# Patient Record
Sex: Female | Born: 1960 | Race: Black or African American | Hispanic: No | Marital: Married | Smoking: Former smoker
Health system: Southern US, Community
[De-identification: ages and names within clinical notes are randomized; demographics above are authoritative.]

## PROBLEM LIST (undated history)

## (undated) DIAGNOSIS — D573 Sickle-cell trait: Secondary | ICD-10-CM

## (undated) DIAGNOSIS — G43909 Migraine, unspecified, not intractable, without status migrainosus: Secondary | ICD-10-CM

## (undated) DIAGNOSIS — IMO0001 Reserved for inherently not codable concepts without codable children: Secondary | ICD-10-CM

## (undated) DIAGNOSIS — E538 Deficiency of other specified B group vitamins: Secondary | ICD-10-CM

## (undated) DIAGNOSIS — Z5189 Encounter for other specified aftercare: Secondary | ICD-10-CM

## (undated) DIAGNOSIS — I10 Essential (primary) hypertension: Secondary | ICD-10-CM

## (undated) DIAGNOSIS — O2230 Deep phlebothrombosis in pregnancy, unspecified trimester: Secondary | ICD-10-CM

## (undated) HISTORY — PX: TUBAL LIGATION: SHX77

---

## 2005-01-21 ENCOUNTER — Ambulatory Visit: Payer: Self-pay | Admitting: Family Medicine

## 2005-08-05 ENCOUNTER — Emergency Department (HOSPITAL_COMMUNITY): Admission: EM | Admit: 2005-08-05 | Discharge: 2005-08-05 | Payer: Self-pay | Admitting: Emergency Medicine

## 2007-08-19 ENCOUNTER — Ambulatory Visit (HOSPITAL_COMMUNITY): Admission: RE | Admit: 2007-08-19 | Discharge: 2007-08-19 | Payer: Self-pay | Admitting: Family Medicine

## 2007-12-02 ENCOUNTER — Encounter: Payer: Self-pay | Admitting: Family Medicine

## 2008-06-15 ENCOUNTER — Observation Stay (HOSPITAL_COMMUNITY): Admission: EM | Admit: 2008-06-15 | Discharge: 2008-06-16 | Payer: Self-pay | Admitting: Emergency Medicine

## 2008-06-16 ENCOUNTER — Ambulatory Visit: Payer: Self-pay | Admitting: Internal Medicine

## 2008-06-16 ENCOUNTER — Encounter (INDEPENDENT_AMBULATORY_CARE_PROVIDER_SITE_OTHER): Payer: Self-pay | Admitting: Internal Medicine

## 2008-06-19 ENCOUNTER — Ambulatory Visit: Payer: Self-pay | Admitting: Gastroenterology

## 2008-06-27 ENCOUNTER — Ambulatory Visit: Payer: Self-pay | Admitting: Gastroenterology

## 2008-06-27 ENCOUNTER — Encounter: Payer: Self-pay | Admitting: Gastroenterology

## 2008-06-27 ENCOUNTER — Ambulatory Visit (HOSPITAL_COMMUNITY): Admission: RE | Admit: 2008-06-27 | Discharge: 2008-06-27 | Payer: Self-pay | Admitting: Gastroenterology

## 2008-07-12 ENCOUNTER — Ambulatory Visit: Payer: Self-pay | Admitting: Cardiology

## 2008-07-18 ENCOUNTER — Encounter (HOSPITAL_COMMUNITY): Admission: RE | Admit: 2008-07-18 | Discharge: 2008-08-17 | Payer: Self-pay | Admitting: Oncology

## 2008-07-18 ENCOUNTER — Ambulatory Visit (HOSPITAL_COMMUNITY): Payer: Self-pay | Admitting: Oncology

## 2008-09-04 ENCOUNTER — Ambulatory Visit: Payer: Self-pay | Admitting: Internal Medicine

## 2008-12-22 ENCOUNTER — Encounter (HOSPITAL_COMMUNITY): Admission: RE | Admit: 2008-12-22 | Discharge: 2009-01-21 | Payer: Self-pay | Admitting: Oncology

## 2008-12-22 ENCOUNTER — Ambulatory Visit (HOSPITAL_COMMUNITY): Payer: Self-pay | Admitting: Oncology

## 2009-05-30 DIAGNOSIS — I10 Essential (primary) hypertension: Secondary | ICD-10-CM | POA: Insufficient documentation

## 2009-05-30 DIAGNOSIS — R9431 Abnormal electrocardiogram [ECG] [EKG]: Secondary | ICD-10-CM

## 2009-05-30 DIAGNOSIS — I6529 Occlusion and stenosis of unspecified carotid artery: Secondary | ICD-10-CM | POA: Insufficient documentation

## 2009-06-26 ENCOUNTER — Encounter: Payer: Self-pay | Admitting: Gastroenterology

## 2010-03-20 IMAGING — US US CAROTID DUPLEX BILAT
1 series · 13 of 24 positions shown · non-contrast
Comparison: None

CLINICAL DATA: POSSIBLE BRUIT

BILATERAL CAROTID DUPLEX ULTRASOUND
TECHNIQUE: Gray scale imaging, color Doppler and duplex ultrasound
was performed of bilateral carotid and vertebral arteries in the
neck.

[Series 1: us carotid duplex bilat · 0.08mm/px · 13 of 79 slices shown]
[im 1/79]
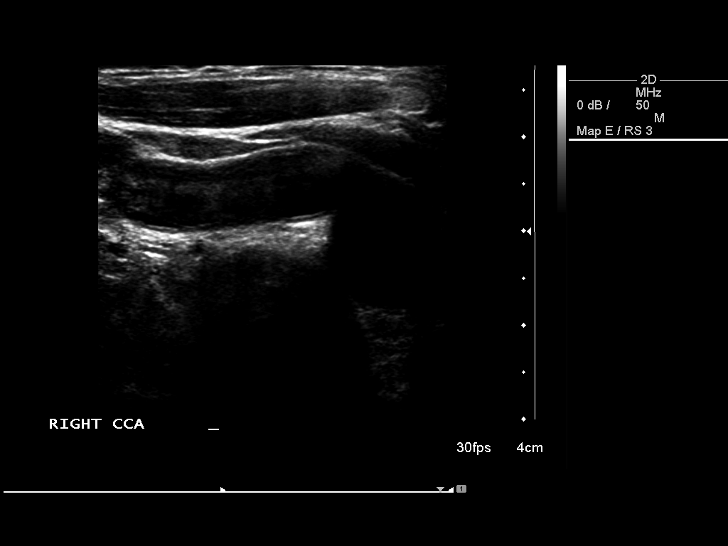
[im 7/79]
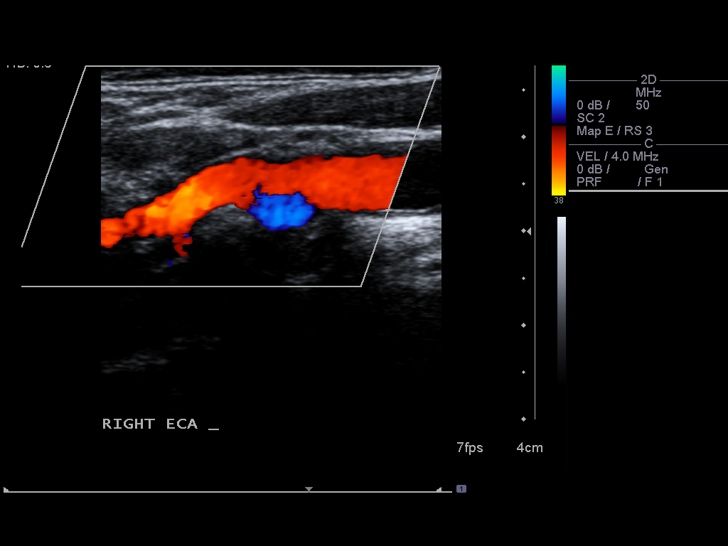
[im 14/79]
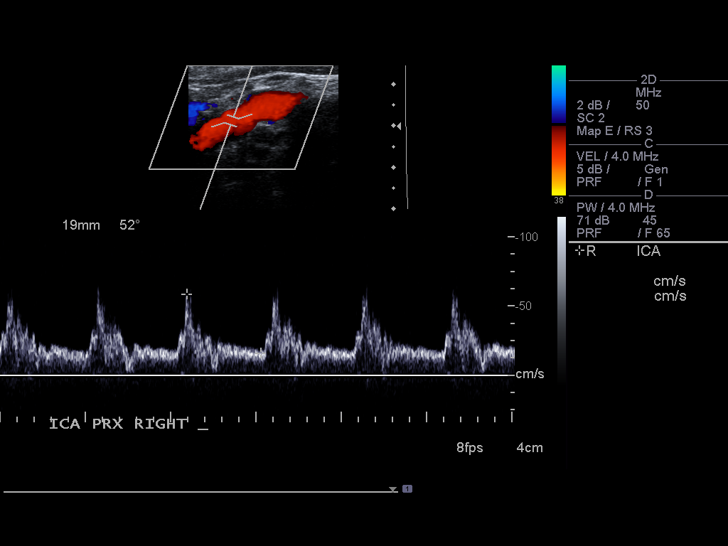
[im 21/79]
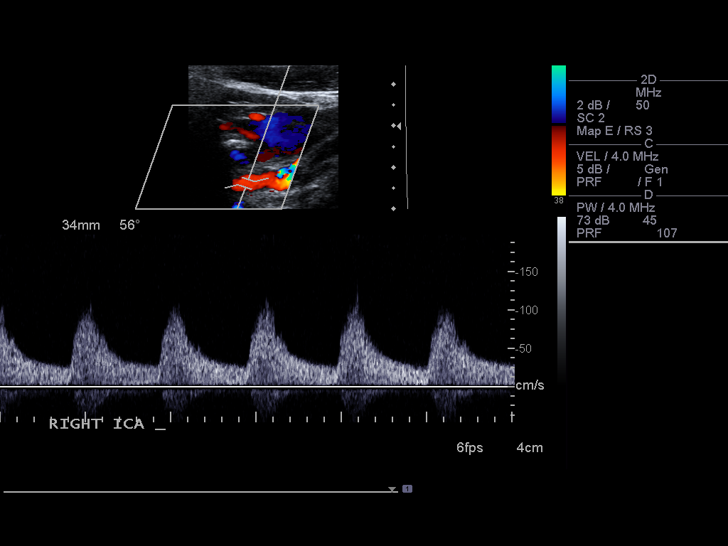
[im 28/79]
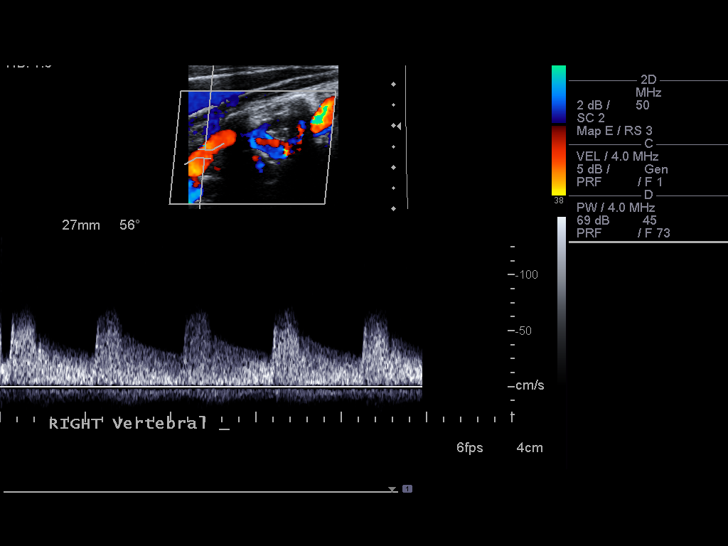
[im 34/79]
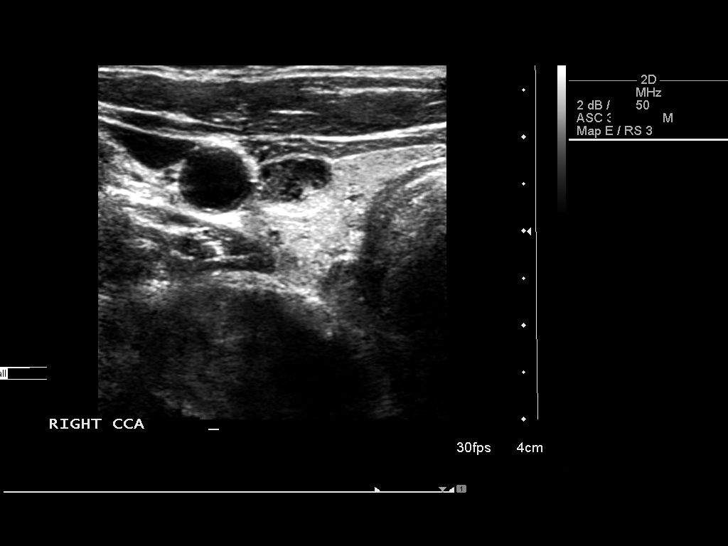
[im 41/79]
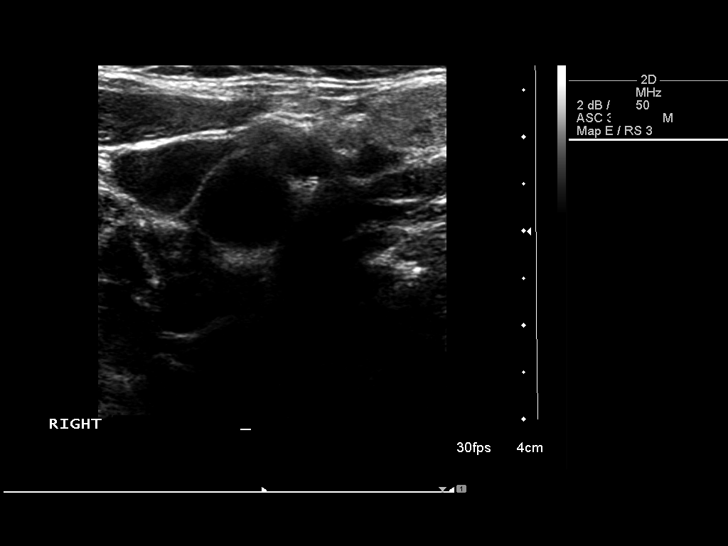
[im 45/79]
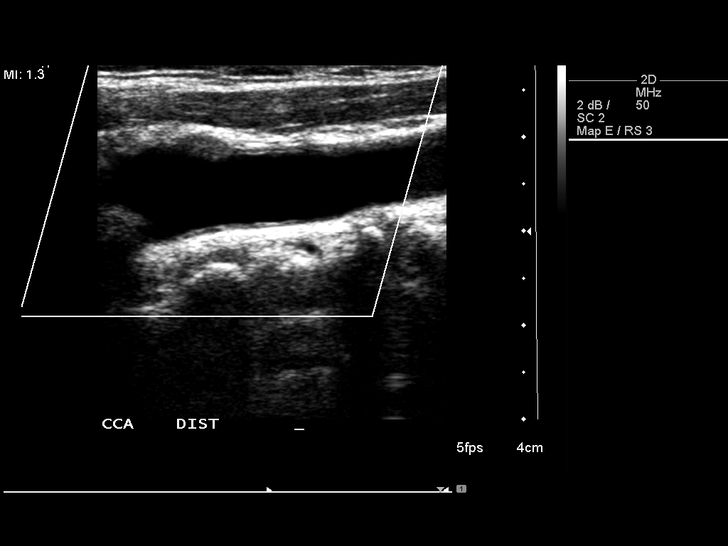
[im 51/79]
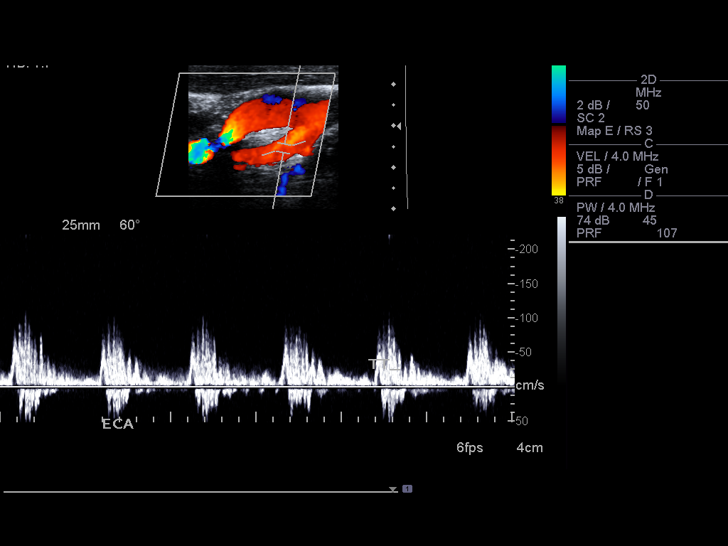
[im 58/79]
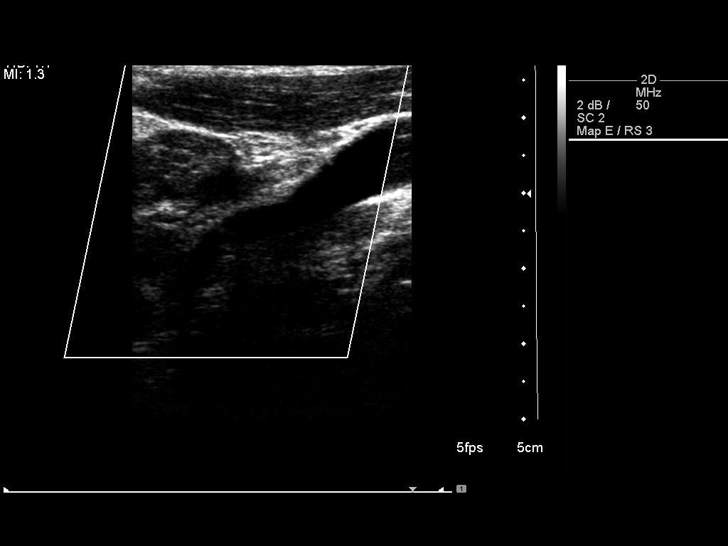
[im 65/79]
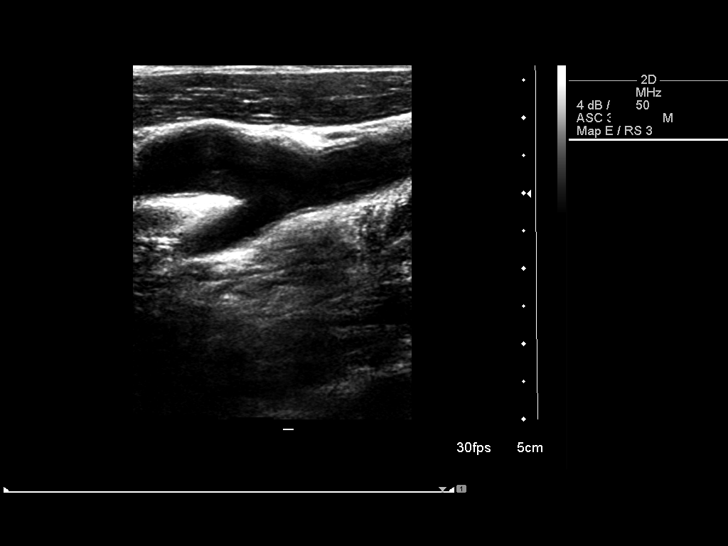
[im 72/79]
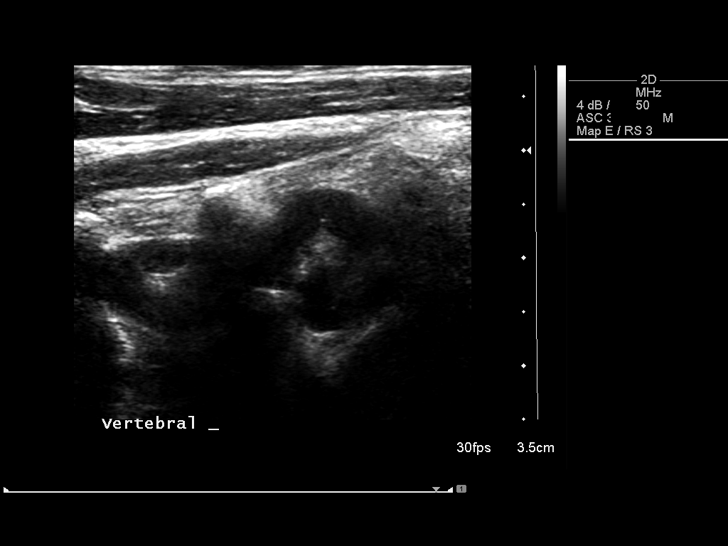
[im 79/79]
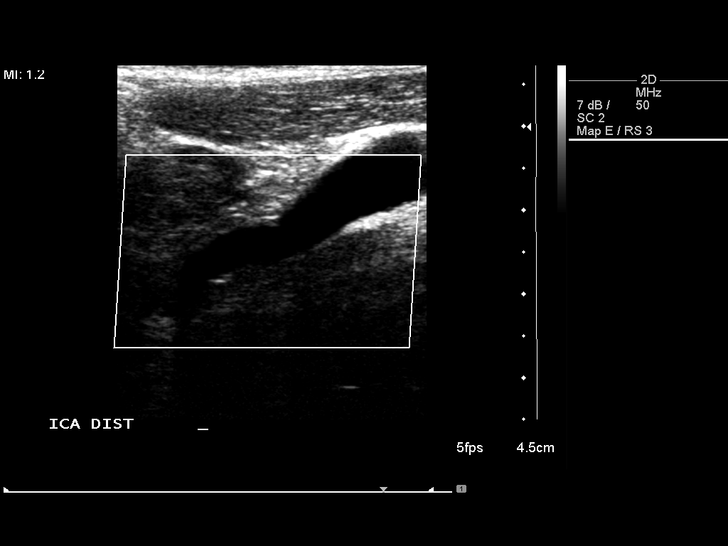

[13 of 24 positions shown; findings below may reference images not displayed]

Criteria:  Quantification of carotid stenosis is based on velocity
parameters that correlate the residual internal carotid diameter
with NASCET-based stenosis levels.

The following velocity measurements were obtained:

                 PEAK SYSTOLIC/END DIASTOLIC
RIGHT
ICA:                        126cm/sec
CCA:                        87cm/sec
SYSTOLIC ICA/CCA RATIO:
DIASTOLIC ICA/CCA RATIO:
ECA:                        83cm/sec

LEFT
ICA:                        180cm/sec
CCA:                        81cm/sec
SYSTOLIC ICA/CCA RATIO:
DIASTOLIC ICA/CCA RATIO:
ECA:                        106cm/sec
FINDINGS: RIGHT CAROTID ARTERY: Right carotid arteries are patent with
antegrade flow.  There is minimal atherosclerotic plaque in the
right carotid arteries.  There is a normal low resistance waveform
involving the right internal carotid artery.  The peak systolic
velocity in the right internal carotid arteries 126 cm/sec but this
is in the distal aspect.  The mid right internal carotid artery has
a peak systolic velocity of 63 cm/sec.  Distal right internal
carotid artery is deep and there may be a small amount of plaque in
this region.

RIGHT VERTEBRAL ARTERY:  Antegrade flow in the right vertebral
artery with a normal low resistance waveform.

LEFT CAROTID ARTERY: Antegrade flow in the left carotid arteries
without significant plaque.  Similar to the right side, the distal
left internal carotid artery is tortuous with an elevated peak
systolic velocity of 180 cm/sec.  The mid left internal carotid
artery has a peak systolic velocity of 101 cm/sec.

LEFT VERTEBRAL ARTERY:  Antegrade flow in the left ]  Vertebral
artery with a normal waveform.
IMPRESSION: Minimal atherosclerotic plaque in the carotid arteries.  The
proximal internal carotid arteries appear to be widely patent.
Slightly elevated peak systolic velocities in the distal internal
carotid arteries bilaterally.  Suspect that this finding is related
to the configuration of these vessels rather than true stenosis.
Degree of narrowing measures between 50-69% based on the peak
systolic velocities but feel that this is an overestimation.
Recommend follow-up surveillance.

## 2010-05-09 ENCOUNTER — Ambulatory Visit (HOSPITAL_COMMUNITY): Admission: RE | Admit: 2010-05-09 | Discharge: 2010-05-09 | Payer: Self-pay | Admitting: Family Medicine

## 2010-12-31 NOTE — Letter (Signed)
Summary: rpc chart  rpc chart   Imported By: Curtis Sites 09/12/2010 15:55:28  _____________________________________________________________________  External Attachment:    Type:   Image     Comment:   External Document

## 2011-03-17 LAB — FERRITIN: Ferritin: 44 ng/mL (ref 10–291)

## 2011-03-17 LAB — CBC
HCT: 35.2 % — ABNORMAL LOW (ref 36.0–46.0)
Hemoglobin: 11.8 g/dL — ABNORMAL LOW (ref 12.0–15.0)
RBC: 4.07 MIL/uL (ref 3.87–5.11)
RDW: 14.2 % (ref 11.5–15.5)
WBC: 5 10*3/uL (ref 4.0–10.5)

## 2011-03-17 LAB — METHYLMALONIC ACID, SERUM: Methylmalonic Acid, Quantitative: 139 nmol/L (ref 87–318)

## 2011-03-17 LAB — VITAMIN B12: Vitamin B-12: 647 pg/mL (ref 211–911)

## 2011-04-15 NOTE — Assessment & Plan Note (Signed)
NAME:  CATHELINE, HIXON               CHART#:  [id number]   DATE:  09/04/2008                       DOB:  [birth year]   CHIEF COMPLAINT:  Follow up of procedure.   SUBJECTIVE:  The patient is here for followup.  She underwent EGD with  ileocolonoscopy on June 27, 2008, for history of iron deficiency anemia.  She had multiple sessile gastric polyps in the antrum.  Two 1-cm gastric  polyps were removed via snare cautery.  Bases were tattooed.  The  remaining polyps were biopsied.  The pathology revealed hyperplastic  polyps.  No evidence of H. pylori.  It was felt that if she had ongoing  iron deficiency anemia, she may need to have remaining polyps removed.  She presents back today.  She had blood work done on August 31, 2008.  Her hemoglobin is now 13.6, and her ferritin is 51.  She feels well.  She sees no blood in her stool or melena.  No abdominal pain.  No  nausea, vomiting or heartburn.  She was also noted to be B12 deficient  and is receiving monthly injections.   CURRENT MEDICATIONS:  See updated list.   ALLERGIES:  No known drug allergies.   PHYSICAL EXAMINATION:  VITAL SIGNS:  Weight 157, temp 98.1, blood  pressure 124/70, and pulse 76.  GENERAL:  A pleasant, well-nourished, well-developed black female in no  acute distress.  SKIN:  Warm and dry.  No jaundice.  HEENT:  Sclerae nonicteric.  Oropharyngeal mucosa moist and pink.  ABDOMEN:  Positive bowel sounds.  Abdomen is soft.   IMPRESSION:  The patient is a 50 year old lady with history of profound  anemia and iron deficiency as well as B12 deficiency.  She was found to  have multiple gastric polyps, two were 1 cm in size and were removed via  snare cautery.  We felt that this could be the source of her iron  deficiency anemia.  She has been doing very well.  Her hemoglobin is now  normal with a ferritin that is normal as well.  I would recommend her  continue iron therapy at least another 3 months.   PLAN:  1. She  will continue to have her hemoglobin checked periodically      through Dr. Otilio Saber Office.  She states she is getting monthly      labs done due to her B12 as well with his office.  2. She will continue to follow up with Dr. Mariel Sleet as planned.  3. Continue iron for 3 more months.  4. If she has recurrent iron deficiency anemia at any point, we will      consider repeat EGD for      gastric polypectomy as recommended by Dr. Kassie Mends.  5. Office visit p.r.n.       Tana Coast, P.A.  Electronically Signed     Kassie Mends, M.D.  Electronically Signed    LL/MEDQ  D:  09/05/2008  T:  09/05/2008  Job:  [id number]   cc:   Melvyn Novas, MD  Ladona Horns. Mariel Sleet, MD

## 2011-04-15 NOTE — H&P (Signed)
NAME:  Annette Cox, MCALPINE              ACCOUNT NO.:  [id number]   MEDICAL RECORD NO.:  [id number]          PATIENT TYPE:  OBV   LOCATION:  A339                          FACILITY:  [facility]   PHYSICIAN:  Osvaldo Shipper, MD     DATE OF BIRTH:  [birth year]   DATE OF ADMISSION:  06/15/2008  DATE OF DISCHARGE:  LH                              HISTORY & PHYSICAL   The patient does not have a PMD.  She goes to the [facility] for her medical needs.   ADMISSION DIAGNOSES:  1. Severe microcytic anemia.  2. Hypertension.  3. 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  4. Chest pressure.   CHIEF COMPLAINT:  Lightheadedness, dizziness, and chest pressures this  morning.   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   Dictation ended at this point.   PLEASE SEE ADDENDUM      Osvaldo Shipper, MD  Electronically Signed     GK/MEDQ  D:  06/15/2008  T:  06/16/2008  Job:  [id number]

## 2011-04-15 NOTE — Op Note (Signed)
NAME:  BLAYRE, PAPANIA              ACCOUNT NO.:  [id number]   MEDICAL RECORD NO.:  [id number]          PATIENT TYPE:  AMB   LOCATION:  DAY                           FACILITY:  [facility]   PHYSICIAN:  Kassie Mends, M.D.      DATE OF BIRTH:  1961/06/23   DATE OF PROCEDURE:  06/27/2008  DATE OF DISCHARGE:                               OPERATIVE REPORT   REFERRING PHYSICIAN:  [facility].   PROCEDURE:  1. Ileocolonoscopy.  2. Esophagogastroduodenoscopy with cold forceps biopsy and snare      cautery polypectomy.   INDICATION FOR EXAM:  Ms. Mccomas is a 50 year old female who presents  with a ferritin less than 1.  She has a menstrual cycle once every 3 to  6 months.  She does not eat meat every day.  She denies any black tarry  stools.  She uses ibuprofen less than once a week.  She denies any  diarrhea, change in bowel habits or rectal bleeding.  She was started on  iron approximately 2 weeks ago.   FINDINGS:  1. Fair bowel prep.  Polyps less than 5 mm could have been missed.  2. Normal terminal ileum approximately 10 cm visualized.  3. Normal colon without evidence of polyps, masses, inflammatory      changes, diverticula or arteriovenous malformations.  4. Normal retroflexed view of the rectum.  5. Normal esophagus without evidence of Barrett's mass, erosion,      ulceration or stricture.  6. Small hiatal hernia.  7. Multiple sessile gastric polyps seen in the antrum.  Two 1 cm      gastric polyps were removed via snare cautery.  The bases were      tattooed with Spot.  The remaining polyps were biopsied via cold      forceps.  Each polyp was sent in a separate container.  The cold      forceps biopsies were sent in a third container.  No ulcers      appreciated.  8. Normal duodenal bulb and second portion of the duodenum.   DIAGNOSIS:  1. Gastric polyps, likely source for her iron deficiency anemia.  2. Normal colonoscopy.  3. Elevated blood  pressure.   RECOMMENDATIONS:  1. She should avoid aspirin and NSAIDs for 30 days.  No      anticoagulation for 7 days.  2. Will call with the results of her biopsies.  3. Follow-up appointment in 4 weeks with Tana Coast.  4. Add clonidine 0.1 mg b.i.d.  for the next 72 hours.  5. Appointment with [facility] on June 28, 2008 around 9:30 a.m.   MEDICATIONS:  1. Demerol 100 mg IV.  2. Versed 7 mg IV.  3. Hydralazine 10 mg IV.   PROCEDURE TECHNIQUE:  Physical exam was performed.  Informed consent was  obtained from the patient after explaining benefits, risks and  alternatives to procedure.  The patient was connected to monitor and  placed in left lateral position.  Continuous oxygen was provided by  nasal cannula and IV medicine  administered through an indwelling  cannula.  After administration of sedation and rectal exam, the  patient's rectum was intubated and the scope was advanced under direct  visualization to the distal terminal ileum.  The scope was removed  slowly by carefully examining the color, texture, anatomy and integrity  of the mucosa on the way out.   After the colonoscopy, the patient's esophagus was intubated with a  diagnostic gastroscope and the scope was advanced under direct  visualization to the second portion of the duodenum.  The scope was  removed slowly.  The retroflex view of the cardia was performed.  The  first polyp was snared with snare cautery (200/25).  The scope was  removed by carefully examining the color, texture, anatomy and integrity  of mucosa on the way out.  The patient's esophagus was then intubated  with a diagnostic gastroscope and advanced under direct visualization to  the antrum.  The second polyp was removed via snare cautery (200/25).  Both bases of the larger polyps were tattooed with Spot.  Each polyp was  removed with the Lucina Mellow net.  The scope was removed.  The scope was then  reintroduced into  the esophagus and advanced into the antrum.  Cold  forceps biopsies were obtained.  The scope was removed slowly by  carefully examining the color, texture, anatomy and integrity of the  mucosa on the way out.  Patients BP remained elevated and hydralzine was  administered. The patient was recovered in endoscopy and discharged home  in satisfactory condition.   PATH:  Hyperplastic polyp      Kassie Mends, M.D.  Electronically Signed     SM/MEDQ  D:  06/27/2008  T:  06/27/2008  Job:  [id number]

## 2011-04-15 NOTE — Discharge Summary (Signed)
NAME:  Annette Cox, Annette Cox              ACCOUNT NO.:  [id number]   MEDICAL RECORD NO.:  [id number]          PATIENT TYPE:  OBV   LOCATION:  A339                          FACILITY:  [facility]   PHYSICIAN:  Osvaldo Shipper, MD     DATE OF BIRTH:  1961-02-01   DATE OF ADMISSION:  06/15/2008  DATE OF DISCHARGE:  07/17/2009LH                               DISCHARGE SUMMARY   The patient goes to the health department for her medical needs.   Please review my H&P dictated yesterday for details regarding the  patient's presenting illness.   DISCHARGE DIAGNOSES:  1. Severe iron deficiency anemia.  2. Sickle cell trait.  3. Status post blood transfusion.  4. Vitamin B12 Deficiency   BRIEF HOSPITAL COURSE:  Briefly, this is a 50 year old African American  female who has a remote history of DVT and hypertension, who was in her  usual state of health until actually a few months ago, 6 to 8 months  ago, when she started noticing that she was feeling more weak and was  getting short of breath with activities.  The patient ignored those  symptoms.  Yesterday, when she was at work in a poorly-ventilated area,  she felt really hot, sweaty, got light-headed, dizzy, and experienced  brief chest pressure.  The patient came into the ED to get herself  evaluated.  She underwent blood work that showed a hemoglobin of 4.8.  The rest of her labs were pretty unremarkable.  Her MCV was 54.  LFTs  were normal.  Iron profile studies were sent, which showed a ferritin of  less than 1, B12 was 200.  She was found to be heme negative and had  brown stool on rectal exam done by the ED physician.  She admits to  going through menopause with periods every 6 months or so.  She is  having period right now, but she states not really heavy.  The patient  was transfused 2 units of blood.  Her hemoglobin has gone to 8.9.  Really, no other overt bleeding was noted in this individual.  It is  felt that the patient would require  evaluation as an outpatient with  gastroenterology, as she is 50 and will probably require colonoscopy at  least.  This has been arranged for her.  Her sickle cell trait had come  back positive.  Because of her iron deficiency anemia and B12  deficiency, I am going to go ahead and set her up with Dr. Mariel Sleet as  well.  I will give her a shot of B12 before she goes home today.  Nu-  Iron will also be prescribed to her on an outpatient basis.  Once again,  it should be noted that the patient is not having any overt GI bleeding.  She is having some minimal menstrual periods at this time.  No masses  are appreciated on physical examination.  Hemodynamically, she is  stable, so I think the rest of the evaluations can be done as an  outpatient.   On the day of discharge, she seemed better.  Her symptoms have  almost  resolved.  Vital signs are stable.  Yesterday, she was hypertensive in  the ED.  her blood pressures have improved, 140/80.  Still not very  optimum, but I would rather the health department follow up for her  blood pressure.  Home medications can be initiated at this time.  Examination otherwise was unremarkable.   Cardiac markers have been negative.  EKG did not show any significant  abnormalities.  Echocardiogram is pending at this time.  Once the echo  is done, she can be discharged home.  I will follow up on the results of  the echocardiogram and, if it is significantly abnormal, we will set her  up with a cardiologist.   DISCHARGE MEDICATIONS:  1. Nu-Iron 150 mg once daily for 1 week and then b.i.d.  2. Omeprazole 20 mg once daily for 3 weeks.  3. Vitamin B12 1000 mcg every month.  She will be given 1 dose today      and needs to start taking this on a monthly basis starting August      17.  A prescription will be written for the same.   FOLLOWUP:  1. Gastroenterology, appointment has been made on June 19, 2008 at      10:45 a.m. with Dr. Jena Gauss or Dr. Cira Servant.  Phone  number [phone].  2. With Dr. Mariel Sleet in 1 month.  3. With health department for blood pressure.  This, she can do on her      own.  4. I have also told her that she will need to be seen by a      gynecologist at some point.  I will let her choose her      gynecologist.  There is no urgent need.   DIET:  Regular.   PHYSICAL ACTIVITY:  No restrictions.   I have told her that, if she notices excessive bleeding anywhere, she  again feels light-headed and dizzy, experiences chest pain, she needs to  seek attention immediately.   Carotid Dopplers have just been resulted.  No significant stenosis was  noted, although followup was recommended for her.   Addendum:  Message was left with [facility] to make an appointment for the  patient due to abnormal EKG.      Osvaldo Shipper, MD  Electronically Signed     GK/MEDQ  D:  06/16/2008  T:  06/16/2008  Job:  [id number]

## 2011-04-15 NOTE — Consult Note (Signed)
NAME:  Annette Cox, Annette Cox              ACCOUNT NO.:  [id number]   MEDICAL RECORD NO.:  [id number]          PATIENT TYPE:  AMB   LOCATION:  DAY                           FACILITY:  [facility]   PHYSICIAN:  Kassie Mends, M.D.      DATE OF BIRTH:  [birth year]   DATE OF CONSULTATION:  DATE OF DISCHARGE:                                 CONSULTATION   REQUESTING PHYSICIAN:  Osvaldo Shipper, MD   HISTORY OF PRESENT ILLNESS:  Ms. Larrick is a very pleasant African  American female who presented to the hospital on June 15, 2008, for  further evaluation of lightheadedness, dizziness and chest pressure.  All these symptoms started when she was at work.  She has working in the  [employer] where there is not much ventilation and there  was lot of heat.  She states usually she is able to just put cold wash  right on her head and she feels better shortly afterwards.  At this  time, however, she felt very lightheaded like she might pass out.  She  decided therefore to come to the hospital.  She also had some shortness  of breath when this happened.  Generally, she has been fatigued which  she thought was because of all the work she has been doing.  She denies  any abdominal pain, nausea or vomiting, constipation, diarrhea, melena  or rectal bleeding.  She has had no hematemesis or nosebleeds.  She has  been going through menopause.  She states she has her period once every  6 months or so.  She had one during this recent evaluation.  Her vaginal  bleeding has been very light.  She denies any dysuria or hematuria.  She  lost 80 pounds over a couple of years stating that was intentional.  She  changed her diet.  She really did not add any exercise.  She notes that  she does not eat a lot of red meat but she does eat a lot of chicken,  fish and dark green vegetables.  She generally has a bowel movement  every 1-2 days.  She has never had a colonoscopy.  She states the last  time she had a blood work  was at the health department about a year ago.  She denies being told she was anemic before.   CURRENT MEDICATIONS:  1. Omeprazole 20 mg daily.  2. Poly-iron 150 mg b.i.d.  3. She takes occasional ibuprofen for headache, but does not take any      NSAIDs or aspirin regularly.   ALLERGIES:  No known drug allergies.   PAST MEDICAL HISTORY:  History of hypertension in the past but has not  required any medications then she lost weight.  History of DVTs.  She  was diagnosed in 30 with her first pregnancy.  She had DVTs in her  second and third pregnancy as well requiring blood thinners.  With her  fourth pregnancy, she was empirically placed on blood thinners and did  not experience any DVTs at that time.  She had one more episode about 10  or 15 years ago.  The patient has a history of sickle cell trait.  She  has had a cesarean section, tubal ligation, no prior EGD or colonoscopy.   FAMILY HISTORY:  Negative for colorectal cancer or IBD or other chronic  GI illnesses.  Her mother has diabetes, hypertension, and heart disease.  Her father died at age 65 of lung cancer.  She had a cousin who died due  to complications of sickle cell disease.   SOCIAL HISTORY:  She lives with her husband.  She has four children.  She is employed at [employer].  She quit smoking 15 years ago.  No alcohol  or drug use.   REVIEW OF SYSTEMS:  See HPI for GI.  See HPI for general and  cardiopulmonary.  GENITOURINARY:  Denies dysuria, hematuria.   PHYSICAL EXAM:  VITAL SIGNS:  Weight 151, height 5 feet 4 inches,  temperature 98, blood pressure 130/82, and pulse 64.  GENERAL:  Pleasant, well-nourished, well-developed pale black female in  no acute distress.  SKIN:  Warm and dry.  No jaundice.  HEENT:  Sclerae nonicteric.  Oropharyngeal mucosa moist and pink.  No  lesions, erythema or exudate.  CHEST:  Lungs are clear to auscultation.  CARDIAC:  Reveals regular rate and rhythm.  Normal S1-S2.  No murmurs,   rubs or gallops.  ABDOMEN:  Positive bowel sounds.  Soft, nontender, and nondistended.  No  organomegaly or masses.  No rebound or guarding.  No abdominal bruits or  hernias.  LOWER EXTREMITIES:  No edema.  Rectal exam was done in the emergency department.  She had normal color  stool which was heme negative.   LABORATORY DATA:  On admission, her hemoglobin was 4.8, hematocrit 16.5,  MCV 54.7.  Her PT/INR was normal.  LFTs normal.  Her iron was less than  10, ferritin less than 1, B12 was low at 200.  TSH normal.  Folate  normal.  Sickle cell test (Sickledex) was positive.  Her hemoglobin  prior to discharge was 8.9.  The patient states she received 3 units of  packed red blood cells.  On admission, her CBC differential revealed  polychromasia teardrop cells, cystocytes, and rouleaux.  Her  reticulocyte count was normal.  RBCs were slightly low at 3.01.   IMPRESSION:  The patient is a very pleasant 50 year old lady with  profound microcytic anemia with both iron deficiency and B12 deficiency.  She has had no signs of overt GI bleeding.  Her Sickledex test was  positive as she has a history of sickle cell trait.  She has remote  history of multiple DVTs predominantly during pregnancy but apparently  at least one episodes after her last pregnancy.  She has never had  endoscopy.  Would recommend colonoscopy and possible  esophagogastroduodenoscopy for further evaluation of iron-deficiency  anemia.  Definitely agree with hematological evaluation with B12  deficiency and positive Sickledex as well.   PLAN:  Colonoscopy and possible EGD based on findings with Dr. Cira Servant in  the near future.  We will hold her iron 7 days prior to procedure.  She  will continue omeprazole for now.  Further recommendations to follow.  We will try to obtain the last set of labs done at the [facility].      Tana Coast, P.A.      Kassie Mends, M.D.  Electronically Signed     LL/MEDQ  D:  06/19/2008  T:  06/20/2008  Job:  [id number]  cc:   [facility]   Osvaldo Shipper, MD   Ladona Horns. Mariel Sleet, MD  Fax: [phone]

## 2011-04-15 NOTE — H&P (Signed)
NAME:  Annette Cox, Annette Cox              ACCOUNT NO.:  [id number]   MEDICAL RECORD NO.:  [id number]          PATIENT TYPE:  EMS   LOCATION:  ED                            FACILITY:  [facility]   PHYSICIAN:  Osvaldo Shipper, MD     DATE OF BIRTH:  05-Jul-1961   DATE OF ADMISSION:  06/15/2008  DATE OF DISCHARGE:  LH                              HISTORY & PHYSICAL   ADDENDUM:   HISTORY OF PRESENT ILLNESS:  The patient is a 50 year old African  American female who has a past remote history of DVTs, who presented to  the hospital today with complaints of light-headedness, dizziness and  chest pressure.  Later she advised and told me that she had a funny  feeling in her chest, but she could not characterize this pain.  All  these symptoms started when she was at work.  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  She felt light headed, almost had a syncopal  episode, but did not pass out.  She came out, she applied some cold rags  on her head, did not feel any better and decided to come into the  hospital.  She was also short of breath when these symptoms happened.  She denies any fever or chills recently.  No history of any flu-like  illness recently.  All of her other above-mentioned symptoms have  resolved.  Denies any abdominal pain, nausea or vomiting.  She does  mention that she thinks she is undergoing menopause.  She is getting  periods once every 6 months.  She is undergoing menstruation right now,  last episode was in January.  She says the periods are not that heavy.   She also mentions that has had similar episodes of dizziness, light-  headedness and shortness of breath many times over the past 6-8 months.   MEDICATIONS:  None at home.   ALLERGIES:  NO KNOWN DRUG ALLERGIES.   PAST MEDICAL HISTORY:  She has been diagnosed with hypertension in the  past, but has not been on any medications since she lost weight.   She  used to be on Tenormin in the past.  History of DVTs is present, a quite  interesting history.  It was diagnosed in 1981, with her first pregnancy  and she had DVTs with her second and third pregnancy as well and  required blood thinners.  With her fourth pregnancy, she was empirically  put on what sounds like possibly Lovenox and then she did not experience  any DVT at that time.  And then she had one more episode about 10-14  years ago.  She has not had anymore episodes since then.  The patient  has a history of sickle cell trait.   PAST SURGICAL HISTORY:  C-section, tubal ligation.  She is up-to-date on  her mammogram and her Pap smear, both of which have been negative.  She  has not had any panendoscopies.   SOCIAL HISTORY:  Lives in [place] with her husband.  Works  in [employer].  Quit smoking 15 years ago.  No alcohol use, no illicit drug use.  Independent with her daily activities.   FAMILY HISTORY:  Negative for any clotting abnormalities. No history of  any thromboembolic events.  History of coronary artery disease in her  mother who is in her 47s.  Grandmother also died with a heart attack.  Cousin died of sickle cell disease.   REVIEW OF SYSTEMS:  GENERAL:  Positive for weakness.  HEENT:  Unremarkable.  CARDIOVASCULAR:  As in HPI.  RESPIRATORY:  As in HPI.  GI:  Unremarkable.  GU:  Unremarkable, except as in HPI.  MUSCULOSKELETAL:  Unremarkable.  NEUROLOGIC:  Unremarkable.  PSYCHIATRIC:  Unremarkable.  DERMATOLOGIC:  Unremarkable.  Other systems  unremarkable.   PHYSICAL EXAMINATION:  VITAL SIGNS:  Temperature 98.7, blood pressure  168/74, 170s/80s.  Heart rate in the 70s, respiratory rate 14,  saturation 100% on room air.  GENERAL:  This is a well-developed, well-nourished individual in no  distress.  HEENT:  There is pallor.  No icterus.  Oral mucous membranes are moist.  No oral lesions are noted.  NECK:  Soft and supple.  No thyromegaly is appreciated.   Tympanic  membranes do not show any  abnormalities.  LUNGS:  Clear to auscultation bilaterally.  No wheezing, rales or  rhonchi.  No dullness to percussion.  CARDIAC:  Point of maximum pulse was palpated.  She does have S1-S2  which is normal, but she does have systolic murmur throughout the  precordium, likely functional.  Another murmur is also present in the  aortic area and radiates to the carotids.  I do not appreciate any  bruits in the carotid, though it is kind of a tough exam with the  radiating murmurs.  ABDOMEN:  Soft, nontender, nondistended.  Bowel sounds are present.  No  mass or organomegaly is appreciated.  No hepatosplenomegaly is present.  I do not appreciate any masses in the lower abdominal area.  EXTREMITIES:  Show no edema.  Peripheral pulses are palpable.  No  erythema or calf tenderness is present.  NEUROLOGIC:  She is alert and oriented x3.  No cranial nerve  abnormalities, sensory or motor  abnormalities are present.   LABORATORY DATA:  Her white count is 5.9, hemoglobin 4.8 with MCV of 54.  Platelet count is 324.  RBC morphology shows polychromasia and teardrop  cells.  Schistocytes 2-5, rouleaux formation is also present.  Large and  giant platelets are seen.  Retic count surprisingly is only 1.1.  Absolute reticulocyte is 33.  Her RDW is 20.  Coags are okay.  BMET is  unremarkable.  One set of cardiac markers are negative.  She had a chest  x-ray which showed cardiomegaly with radiologist wandering about  pericardial effusion.  Otherwise, no other acute abnormalities noted.   ASSESSMENT:  A 50 year old [place] American female who presents with an  episode of light-headedness, dizziness, shortness of breath and funny  feeling in the chest, who was found to have severe microcytic anemia.  All of the above symptoms can by attributed to her severe anemia.  She  does have a history of thromboembolic events in the past, although I do  not have any suspicions for  those at this time.  The reason for severe  anemia is not very clear, most likely iron deficiency.  She does have a  history of sickle cell trait as well.  I think she will need to undergo  gastrointestinal  and gynecologic evaluation at first to begin with, and  then she may well need a hematology opinion.   PLAN:  1. Severe microcytic anemia.  Iron profiles will be checked.  We will      be transfusing her 3 units of blood to get her hemoglobin above 8.      Because of her cardiomegaly, I will give her Lasix in between each      unit of blood.  If it is strongly indictated and if her hemoglobin      does not come up appropriately, we might consider a GI consult to      consider inpatient endoscopy.  Otherwise, I think this can be done      as an outpatient.  A GYN consult will be also obtained as an      outpatient.  Also consider a hematology opinion as an outpatient.      Because of this question of cardiomegaly noted on chest x-ray,      which is probably because of her anemia which probably has been      ongoing for more than 6-8 months.  I am going to go ahead and get      an echocardiogram on this patient as well.  Because I cannot rule      out bruits in the carotids because of the radiating murmurs, I will      get a carotid Doppler as well.  2. Chest pressure, most likely as a result of the anemia.  We will      rule her out for acute coronary syndrome.  She did have an EKG done      here which showed sinus rhythm with normal axis and possible Q-wave      in lead III, T-inversion in V1, also some evidence for LVH is also      present, otherwise no other definite abnormality of concern noted.  3. High blood pressure.  Will monitor this overnight and if it      persists through the night, we will consider treating her in the      morning and then continuing the treatment at home as well.  4. DVT prophylaxis with sequential compressive devices.  PPI will be      initiated for  now.  5. All of the above was explained to the patient and she verbalized      understanding.      Osvaldo Shipper, MD  Electronically Signed     GK/MEDQ  D:  06/15/2008  T:  06/15/2008  Job:  [id number]   cc:   Health Department

## 2011-04-15 NOTE — Assessment & Plan Note (Signed)
[facility]                       [place] [facility] OFFICE NOTE   NAME:Annette Cox, Annette Cox                     MRN:          [id number]  DATE:07/12/2008                            DOB:          23-Apr-1961    REFERRING PHYSICIAN:  Osvaldo Shipper, MD   REASON FOR CONSULTATION:  I was asked by Dr. Rito Ehrlich to consult on  Hilaria Ota with an abnormal EKG and some chest pressure.   HISTORY OF PRESENT ILLNESS:  She is 50 years of age, African American  female who was admitted to [facility] on June 15, 2008.  She  was short of breath, hot, sweaty, lightheaded, dizzy, and experienced  some brief chest pressure.  Her hemoglobin was 4.8.  Her ferritin level  was less than 1, B12 was 200.   She had an endoscopy which showed gastric polyps which was felt to be  the source of her anemia.  She is also sickle cell trait positive.   She received B12 injection and was placed on iron.   She has had no previous cardiac history.  She denies any exertional  angina or significant dyspnea on exertion.  She does have a history of  hypertension.   She has had recent colonoscopy, endoscopy, and biopsy.  Her biopsy was  negative per her record.   She has no other medical illnesses.   PAST MEDICAL HISTORY:  ALLERGIES:  She has no known drug allergies.   CURRENT MEDICATIONS:  1. Clonidine 0.1 mg p.o. b.i.d.  2. Poly-Iron 150 mg b.i.d.   SOCIAL HISTORY:  She is a Geologist, engineering at [employer].  She is married, has 4  children.   FAMILY HISTORY:  Her mother had triple-vessel bypass surgery last year.  Age not given.   REVIEW OF SYSTEMS:  Other than the HPI, she has upper and lower  dentures.  Her other review of systems are negative.   PHYSICAL EXAMINATION:  VITAL SIGNS:  Her blood pressure is 140/80.  I  rechecked it in the left arm with a large cuff, it was 150/80.  Her  pulse is 59 and regular.  Weight is 153.  HEENT:  Normocephalic and atraumatic.   Sclerae are clear.  Conjunctivae  are slightly pale.  Facial symmetry is normal.  NECK:  Supple.  Carotid upstrokes are equal bilaterally with a soft  systolic sound at the right.  Thyroid is not enlarged.  Trachea is  midline.  Neck is supple.  LUNGS:  Clear.  HEART:  Reveals a nondisplaced PMI.  Normal S1 and S2.  No murmur, rub,  or gallop.  ABDOMEN:  Soft.  Good bowel sounds.  No midline bruit.  No hepatomegaly.  EXTREMITIES:  There is no cyanosis, clubbing, or edema.  Pulses are  intact.  NEURO:  Intact.   DATA:  Her EKG was repeated today, shows sinus rhythm with some ST-  segment changes anterior in V3 and V4 as well as some flattening in III  and aVF.  There is no definite LVH.  She is in sinus brady.   A 2D echocardiogram was reviewed from the hospitalization,  shows EF of  60% to 65%, normal LV function, possible mild diastolic dysfunction, and  mildly thickened aortic valve, otherwise negative.   She had carotid Dopplers which showed nonobstructive plaque.   ASSESSMENT AND PLAN:  1. Abnormal electrocardiogram.  This is probably from the stress of      her anemia, acute presentation, and/or her hypertension.  2. Hypertension, not under optimal control.  3. Nonobstructive carotid disease.   RECOMMENDATIONS:  Follow up with Dr. Cira Servant for blood pressure control.  Blood pressure goal 130/80.   I have made no changes in her medical program.  I would recommend repeat  carotid Dopplers in 2 years.     Thomas C. Daleen Squibb, MD, [facility]  Electronically Signed    TCW/MedQ  DD: 07/12/2008  DT: 07/13/2008  Job #: [id number]   cc:   Osvaldo Shipper, MD  Kassie Mends, M.D.

## 2011-08-26 ENCOUNTER — Encounter: Payer: Self-pay | Admitting: Gastroenterology

## 2011-08-29 LAB — HEPATIC FUNCTION PANEL
Albumin: 3.6
Alkaline Phosphatase: 74

## 2011-08-29 LAB — CROSSMATCH: Antibody Screen: NEGATIVE

## 2011-08-29 LAB — POCT CARDIAC MARKERS
Myoglobin, poc: 79.7
Troponin i, poc: <0.05

## 2011-08-29 LAB — DIFFERENTIAL
Basophils Absolute: 0
Basophils Absolute: 0
Basophils Relative: 0
Basophils Relative: 0
Eosinophils Relative: 1
Lymphocytes Relative: 11 — ABNORMAL LOW
Lymphs Abs: 0.6 — ABNORMAL LOW
Monocytes Relative: 6
Neutro Abs: 5
Neutro Abs: 5.6
Neutrophils Relative %: 79 — ABNORMAL HIGH

## 2011-08-29 LAB — CARDIAC PANEL(CRET KIN+CKTOT+MB+TROPI)
CK, MB: 0.9
CK, MB: 0.9
Troponin I: 0.04

## 2011-08-29 LAB — LIPID PANEL
HDL: 43
Total CHOL/HDL Ratio: 3.7
Triglycerides: 61
VLDL: 12

## 2011-08-29 LAB — BASIC METABOLIC PANEL
CO2: 30
Calcium: 9.1
Creatinine, Ser: 0.9
GFR calc Af Amer: >60
GFR calc Af Amer: >60
GFR calc non Af Amer: >60
Sodium: 139

## 2011-08-29 LAB — VITAMIN B12: Vitamin B-12: 200 — ABNORMAL LOW (ref 211–911)

## 2011-08-29 LAB — PROTIME-INR: INR: 1.2

## 2011-08-29 LAB — CBC
HCT: 16.5 — ABNORMAL LOW
Hemoglobin: 4.8 — CL
MCHC: 29.1 — ABNORMAL LOW
MCHC: 31.8
MCV: 54.7 — ABNORMAL LOW
Platelets: 324
RBC: 4.22
RDW: 20 — ABNORMAL HIGH

## 2011-08-29 LAB — IRON AND TIBC: Iron: <10 — ABNORMAL LOW

## 2011-08-29 LAB — RETICULOCYTES: RBC.: 3.01 — ABNORMAL LOW

## 2011-08-29 LAB — SICKLE CELL SCREEN: Sickle Cell Screen: POSITIVE — AB

## 2011-08-29 LAB — TSH: TSH: 3.135

## 2011-10-09 ENCOUNTER — Encounter: Payer: Self-pay | Admitting: Emergency Medicine

## 2011-10-09 ENCOUNTER — Emergency Department (HOSPITAL_COMMUNITY)
Admission: EM | Admit: 2011-10-09 | Discharge: 2011-10-09 | Disposition: A | Discharge status: Home / Self Care | Payer: Self-pay | Attending: Emergency Medicine | Admitting: Emergency Medicine

## 2011-10-09 DIAGNOSIS — Z86718 Personal history of other venous thrombosis and embolism: Secondary | ICD-10-CM | POA: Insufficient documentation

## 2011-10-09 DIAGNOSIS — I10 Essential (primary) hypertension: Secondary | ICD-10-CM | POA: Insufficient documentation

## 2011-10-09 DIAGNOSIS — Z87891 Personal history of nicotine dependence: Secondary | ICD-10-CM | POA: Insufficient documentation

## 2011-10-09 DIAGNOSIS — G43909 Migraine, unspecified, not intractable, without status migrainosus: Secondary | ICD-10-CM | POA: Insufficient documentation

## 2011-10-09 HISTORY — DX: Encounter for other specified aftercare: Z51.89

## 2011-10-09 HISTORY — DX: Deep phlebothrombosis in pregnancy, unspecified trimester: O22.30

## 2011-10-09 HISTORY — DX: Essential (primary) hypertension: I10

## 2011-10-09 HISTORY — DX: Deficiency of other specified B group vitamins: E53.8

## 2011-10-09 HISTORY — DX: Reserved for inherently not codable concepts without codable children: IMO0001

## 2011-10-09 MED ORDER — ATENOLOL 25 MG PO TABS: 50.0000 mg | ORAL_TABLET | Freq: Every day | ORAL | Status: DC

## 2011-10-09 MED ORDER — SODIUM CHLORIDE 0.9 % IV BOLUS (SEPSIS)
1000.0000 mL | Freq: Once | INTRAVENOUS | Status: AC
  Administered 2011-10-09: 1000 mL via INTRAVENOUS

## 2011-10-09 MED ORDER — DIPHENHYDRAMINE HCL 50 MG/ML IJ SOLN
25.0000 mg | Freq: Once | INTRAMUSCULAR | Status: AC
  Administered 2011-10-09: 25 mg via INTRAVENOUS
  Filled 2011-10-09: qty 1

## 2011-10-09 MED ORDER — METOCLOPRAMIDE HCL 5 MG/ML IJ SOLN
10.0000 mg | Freq: Once | INTRAMUSCULAR | Status: AC
  Administered 2011-10-09: 10 mg via INTRAVENOUS
  Filled 2011-10-09: qty 2

## 2011-10-09 MED ORDER — LISINOPRIL 20 MG PO TABS: 10.0000 mg | ORAL_TABLET | Freq: Every day | ORAL | Status: DC

## 2011-10-09 MED ORDER — DEXAMETHASONE SODIUM PHOSPHATE 10 MG/ML IJ SOLN
10.0000 mg | Freq: Once | INTRAMUSCULAR | Status: AC
  Administered 2011-10-09: 10 mg via INTRAVENOUS
  Filled 2011-10-09: qty 1

## 2011-10-09 MED ORDER — HYDROCHLOROTHIAZIDE 25 MG PO TABS: 12.5000 mg | ORAL_TABLET | Freq: Every day | ORAL | Status: DC

## 2011-10-09 NOTE — ED Notes (Signed)
Pt out of bp meds x 2 months due to finances but has not f/u with pcp.

## 2011-10-09 NOTE — ED Notes (Signed)
Migraine x 3 days. Nausea started this am. Last night vision was "splotchy"-none today. nad

## 2011-10-09 NOTE — ED Provider Notes (Signed)
History     CSN: [id number] Arrival date & time: 10/09/2011  2:23 PM   First MD Initiated Contact with Patient 10/09/11 1439      Chief Complaint  Patient presents with  . Migraine    (Consider location/radiation/quality/duration/timing/severity/associated sxs/prior treatment) Patient is a 50 y.o. female presenting with migraine. The history is provided by the patient.  Migraine This is a chronic problem. Episode onset: 3 days ago. The problem occurs constantly. The problem has been gradually worsening. Associated symptoms include headaches. Pertinent negatives include no abdominal pain and no shortness of breath. Exacerbated by: light. The symptoms are relieved by nothing. She has tried acetaminophen, rest and ASA for the symptoms. The treatment provided no relief.    Past Medical History  Diagnosis Date  . Hypertension   . DVT (deep vein thrombosis) in pregnancy   . Vitamin B 12 deficiency   . Blood transfusion     Past Surgical History  Procedure Date  . Cesarean section   . Tubal ligation     History reviewed. No pertinent family history.  History  Substance Use Topics  . Smoking status: Former Games developer  . Smokeless tobacco: Not on file  . Alcohol Use: No    OB History    Grav Para Term Preterm Abortions TAB SAB Ect Mult Living                  Review of Systems  Respiratory: Negative for shortness of breath.   Gastrointestinal: Negative for abdominal pain.  Neurological: Positive for headaches. Negative for dizziness, weakness, light-headedness and numbness.  All other systems reviewed and are negative.    Allergies  Review of patient's allergies indicates no known allergies.  Home Medications   Current Outpatient Rx  Name Route Sig Dispense Refill  . ACETAMINOPHEN 500 MG PO TABS Oral Take 500 mg by mouth every 6 (six) hours as needed. For pain     . ATENOLOL 50 MG PO TABS Oral Take 50 mg by mouth daily.      [name] LISINOPRIL-HYDROCHLOROTHIAZIDE 10-12.5  MG PO TABS Oral Take 1 tablet by mouth daily.        BP 229/114  Pulse 71  Temp 98.2 F (36.8 C)  Resp 19  Ht 5\' 5"  (1.651 m)  Wt 180 lb (81.647 kg)  BMI 29.95 kg/m2  SpO2 100%  Physical Exam  Nursing note and vitals reviewed. Constitutional: She is oriented to person, place, and time. She appears well-developed and well-nourished. She appears distressed.  HENT:  Head: Normocephalic and atraumatic.  Eyes: EOM are normal. Pupils are equal, round, and reactive to light.  Fundoscopic exam:      The right eye shows no papilledema.       The left eye shows no papilledema.  Neck: Normal range of motion. Neck supple.  Cardiovascular: Normal rate, regular rhythm, normal heart sounds and intact distal pulses.  Exam reveals no friction rub.   No murmur heard. Pulmonary/Chest: Effort normal and breath sounds normal. She has no wheezes. She has no rales.  Abdominal: Soft. Bowel sounds are normal. She exhibits no distension. There is no tenderness. There is no rebound and no guarding.  Musculoskeletal: Normal range of motion. She exhibits no tenderness.       No edema  Lymphadenopathy:    She has no cervical adenopathy.  Neurological: She is alert and oriented to person, place, and time. She has normal strength. No cranial nerve deficit or sensory deficit. Gait normal.  photophobia  Skin: Skin is warm and dry. No rash noted.  Psychiatric: She has a normal mood and affect. Her behavior is normal.    ED Course  Procedures (including critical care time)  Labs Reviewed - No data to display No results found.   No diagnosis found.    MDM   Pt with typical migraine HA without sx suggestive of SAH(sudden onset, worst of life, or deficits), infection, or cavernous vein thrombosis.  Normal neuro exam and vital signs. Will give HA cocktail and on re-eval.  Pt is hypertensive here but states she has been off her BP meds now for 2-3 months.  Was taking lisinopril, HCTZ and atenolol  and states just couldn't afford them.  Denies any CP or SOB or stroke like sx.  Will give scripts.  4:41 PM Pt feeling much better.  Will d/c home.  Repeat BP much improved. 162/90     Gwyneth Sprout, MD 10/09/11 1642

## 2011-10-09 NOTE — ED Notes (Signed)
Reports much decrease in headache pain and now rates her pain a 5 on 1-10 scale  B/p 172/87

## 2011-10-09 NOTE — ED Notes (Signed)
"  Migraine headache" for the past three days--History for same and reports this episode to be similar to previous ones.Annette KitchenMarland KitchenNausea this a.m.

## 2011-10-09 NOTE — ED Notes (Signed)
B/P 200/88--Order rcd for IV and IV meds.

## 2011-10-09 NOTE — ED Notes (Signed)
Continues to voice much decrease in discomfort  B/P 187/80

## 2013-01-14 ENCOUNTER — Emergency Department (HOSPITAL_COMMUNITY)
Admission: EM | Admit: 2013-01-14 | Discharge: 2013-01-14 | Disposition: A | Discharge status: Home / Self Care | Payer: Self-pay | Attending: Emergency Medicine | Admitting: Emergency Medicine

## 2013-01-14 ENCOUNTER — Encounter (HOSPITAL_COMMUNITY): Payer: Self-pay | Admitting: Emergency Medicine

## 2013-01-14 DIAGNOSIS — E538 Deficiency of other specified B group vitamins: Secondary | ICD-10-CM | POA: Insufficient documentation

## 2013-01-14 DIAGNOSIS — Z79899 Other long term (current) drug therapy: Secondary | ICD-10-CM | POA: Insufficient documentation

## 2013-01-14 DIAGNOSIS — G43909 Migraine, unspecified, not intractable, without status migrainosus: Secondary | ICD-10-CM | POA: Insufficient documentation

## 2013-01-14 DIAGNOSIS — I10 Essential (primary) hypertension: Secondary | ICD-10-CM | POA: Insufficient documentation

## 2013-01-14 DIAGNOSIS — Z86718 Personal history of other venous thrombosis and embolism: Secondary | ICD-10-CM | POA: Insufficient documentation

## 2013-01-14 DIAGNOSIS — Z87891 Personal history of nicotine dependence: Secondary | ICD-10-CM | POA: Insufficient documentation

## 2013-01-14 HISTORY — DX: Migraine, unspecified, not intractable, without status migrainosus: G43.909

## 2013-01-14 MED ORDER — DEXAMETHASONE SODIUM PHOSPHATE 10 MG/ML IJ SOLN
10.0000 mg | Freq: Once | INTRAMUSCULAR | Status: AC
  Administered 2013-01-14: 10 mg via INTRAVENOUS
  Filled 2013-01-14: qty 1

## 2013-01-14 MED ORDER — SODIUM CHLORIDE 0.9 % IV BOLUS (SEPSIS)
1000.0000 mL | Freq: Once | INTRAVENOUS | Status: AC
  Administered 2013-01-14: 1000 mL via INTRAVENOUS

## 2013-01-14 MED ORDER — BUTALBITAL-APAP-CAFFEINE 50-325-40 MG PO TABS: 1.0000 | ORAL_TABLET | Freq: Four times a day (QID) | ORAL | Status: AC | PRN

## 2013-01-14 MED ORDER — DIPHENHYDRAMINE HCL 50 MG/ML IJ SOLN
25.0000 mg | Freq: Once | INTRAMUSCULAR | Status: AC
  Administered 2013-01-14: 25 mg via INTRAVENOUS
  Filled 2013-01-14: qty 1

## 2013-01-14 MED ORDER — METOCLOPRAMIDE HCL 5 MG/ML IJ SOLN
10.0000 mg | Freq: Once | INTRAMUSCULAR | Status: AC
  Administered 2013-01-14: 10 mg via INTRAVENOUS
  Filled 2013-01-14: qty 2

## 2013-01-14 NOTE — ED Notes (Signed)
Migraine since Monday. Pt has history of same. Nausea but denies vomiting.

## 2013-01-16 NOTE — ED Provider Notes (Signed)
History     CSN: [id number]  Arrival date & time 01/14/13  0911   First MD Initiated Contact with Patient 01/14/13 [phone]      Chief Complaint  Patient presents with  . Migraine    (Consider location/radiation/quality/duration/timing/severity/associated sxs/prior treatment) HPI Comments: Annette Cox is a 52 y.o. Female presenting with migraine headache which has been intermittent over the past 4 days.  The patient has a history of migraine and the current symptoms are similar to previous episodes of migraine headache.  The patients symptoms are frequently  preceded by prodromal symptoms described as flashing lights in her field of vision. She went to bed last night pain free,  But woke around 3 am today with recurrence of migraine.  The patient has left sided pain in association with nausea but no emesis.  There has been no fevers, chills, syncope, confusion or localized weakness.  The patient tried ibuprofen without relief of symptoms.        The history is provided by the patient and the spouse.    Past Medical History  Diagnosis Date  . Hypertension   . DVT (deep vein thrombosis) in pregnancy   . Vitamin B 12 deficiency   . Blood transfusion   . Migraines     Past Surgical History  Procedure Laterality Date  . Cesarean section    . Tubal ligation      History reviewed. No pertinent family history.  History  Substance Use Topics  . Smoking status: Former Games developer  . Smokeless tobacco: Not on file  . Alcohol Use: No    OB History   Grav Para Term Preterm Abortions TAB SAB Ect Mult Living                  Review of Systems  Constitutional: Negative for fever and chills.  HENT: Negative for ear pain, congestion, sore throat, neck pain, neck stiffness and sinus pressure.   Eyes: Negative.   Respiratory: Negative for chest tightness and shortness of breath.   Cardiovascular: Negative for chest pain.  Gastrointestinal: Negative for nausea and abdominal pain.   Genitourinary: Negative.   Musculoskeletal: Negative for joint swelling and arthralgias.  Skin: Negative.  Negative for rash and wound.  Neurological: Positive for headaches. Negative for dizziness, facial asymmetry, speech difficulty, weakness, light-headedness and numbness.  Psychiatric/Behavioral: Negative.     Allergies  Review of patient's allergies indicates no known allergies.  Home Medications   Current Outpatient Rx  Name  Route  Sig  Dispense  Refill  . acetaminophen (TYLENOL) 500 MG tablet   Oral   Take 500 mg by mouth every 6 (six) hours as needed. For pain          . atenolol (TENORMIN) 50 MG tablet   Oral   Take 50 mg by mouth daily.         . B Complex-C (SUPER B COMPLEX PO)   Oral   Take 1 tablet by mouth daily.         . cyanocobalamin (,VITAMIN B-12,) 1000 MCG/ML injection   Intramuscular   Inject 1,000 mcg into the muscle every 30 (thirty) days.         . Ferrous Sulfate (PX IRON) 27 MG TABS   Oral   Take 1 tablet by mouth daily.         [name] ibuprofen (ADVIL,MOTRIN) 200 MG tablet   Oral   Take 400 mg by mouth every 6 (six) hours as needed  for pain or headache.         . lisinopril-hydrochlorothiazide (PRINZIDE,ZESTORETIC) 20-12.5 MG per tablet   Oral   Take 1 tablet by mouth daily.         [name] OVER THE COUNTER MEDICATION   Oral   Take 2 tablets by mouth daily as needed (congestion.). Acetaminophen 325-guaifenesin 200-phenylephrine 5 mg.         . pravastatin (PRAVACHOL) 40 MG tablet   Oral   Take 40 mg by mouth daily.         . butalbital-acetaminophen-caffeine (FIORICET) 50-325-40 MG per tablet   Oral   Take 1 tablet by mouth every 6 (six) hours as needed for headache.   20 tablet   0     BP 144/89  Pulse 59  Temp(Src) 98.1 F (36.7 C) (Oral)  Resp 18  Ht 5\' 5"  (1.651 m)  Wt 180 lb (81.647 kg)  BMI 29.95 kg/m2  SpO2 97%  Physical Exam  Nursing note and vitals reviewed. Constitutional: She is oriented to person,  place, and time. She appears well-developed and well-nourished.  Uncomfortable appearing  HENT:  Head: Normocephalic and atraumatic.  Mouth/Throat: Oropharynx is clear and moist.  Eyes: EOM are normal. Pupils are equal, round, and reactive to light.  Neck: Normal range of motion. Neck supple.  Cardiovascular: Normal rate and normal heart sounds.   Pulmonary/Chest: Effort normal.  Abdominal: Soft. There is no tenderness.  Musculoskeletal: Normal range of motion.  Lymphadenopathy:    She has no cervical adenopathy.  Neurological: She is alert and oriented to person, place, and time. She has normal strength. No sensory deficit. Gait normal. GCS eye subscore is 4. GCS verbal subscore is 5. GCS motor subscore is 6.  Normal heel-shin, normal rapid alternating movements. Cranial nerves III-XII intact.  No pronator drift.  Skin: Skin is warm and dry. No rash noted.  Psychiatric: She has a normal mood and affect. Her speech is normal and behavior is normal. Thought content normal. Cognition and memory are normal.    ED Course  Procedures (including critical care time)  Labs Reviewed - No data to display No results found.   1. Migraine headache     Pt given 1 L NS with migraine cocktail including benadryl, decadron and reglan with complete resolution of headache pain.    MDM  Pt with typical (for her) migraine without neuro deficits per history or exam today, no PE findings suggestive cva, SAH.  She responded appropriately to tx.  She was given script for fioricet for trial prn migraines.  Encourage f/u with pcp,  Otherwise returning here for recheck for any return of sx.         Burgess Amor, [place] 01/16/13 [phone]

## 2013-01-24 NOTE — ED Provider Notes (Signed)
Medical screening examination/treatment/procedure(s) were performed by non-physician practitioner and as supervising physician I was immediately available for consultation/collaboration.  Warden Buffa M Charman Blasco, MD 01/24/13 1916 

## 2014-12-11 ENCOUNTER — Encounter (HOSPITAL_COMMUNITY): Payer: Self-pay | Admitting: *Deleted

## 2014-12-11 ENCOUNTER — Emergency Department (HOSPITAL_COMMUNITY)
Admission: EM | Admit: 2014-12-11 | Discharge: 2014-12-11 | Disposition: A | Discharge status: Home / Self Care | Payer: No Typology Code available for payment source | Attending: Emergency Medicine | Admitting: Emergency Medicine

## 2014-12-11 DIAGNOSIS — E538 Deficiency of other specified B group vitamins: Secondary | ICD-10-CM | POA: Insufficient documentation

## 2014-12-11 DIAGNOSIS — G43909 Migraine, unspecified, not intractable, without status migrainosus: Secondary | ICD-10-CM | POA: Diagnosis not present

## 2014-12-11 DIAGNOSIS — Z79899 Other long term (current) drug therapy: Secondary | ICD-10-CM | POA: Insufficient documentation

## 2014-12-11 DIAGNOSIS — G43009 Migraine without aura, not intractable, without status migrainosus: Secondary | ICD-10-CM

## 2014-12-11 DIAGNOSIS — Z86718 Personal history of other venous thrombosis and embolism: Secondary | ICD-10-CM | POA: Insufficient documentation

## 2014-12-11 DIAGNOSIS — I10 Essential (primary) hypertension: Secondary | ICD-10-CM | POA: Insufficient documentation

## 2014-12-11 DIAGNOSIS — R51 Headache: Secondary | ICD-10-CM | POA: Diagnosis present

## 2014-12-11 DIAGNOSIS — Z87891 Personal history of nicotine dependence: Secondary | ICD-10-CM | POA: Insufficient documentation

## 2014-12-11 MED ORDER — SODIUM CHLORIDE 0.9 % IV BOLUS (SEPSIS)
1000.0000 mL | Freq: Once | INTRAVENOUS | Status: AC
  Administered 2014-12-11: 1000 mL via INTRAVENOUS

## 2014-12-11 MED ORDER — DEXAMETHASONE SODIUM PHOSPHATE 4 MG/ML IJ SOLN
10.0000 mg | Freq: Once | INTRAMUSCULAR | Status: AC
  Administered 2014-12-11: 10 mg via INTRAVENOUS
  Filled 2014-12-11: qty 3

## 2014-12-11 MED ORDER — BUTALBITAL-APAP-CAFFEINE 50-325-40 MG PO TABS: 1.0000 | ORAL_TABLET | Freq: Four times a day (QID) | ORAL | Status: AC | PRN

## 2014-12-11 MED ORDER — DIPHENHYDRAMINE HCL 50 MG/ML IJ SOLN
50.0000 mg | Freq: Once | INTRAMUSCULAR | Status: AC
  Administered 2014-12-11: 50 mg via INTRAVENOUS
  Filled 2014-12-11: qty 1

## 2014-12-11 MED ORDER — METOCLOPRAMIDE HCL 5 MG/ML IJ SOLN
10.0000 mg | Freq: Once | INTRAMUSCULAR | Status: AC
  Administered 2014-12-11: 10 mg via INTRAVENOUS
  Filled 2014-12-11: qty 2

## 2014-12-11 MED ORDER — KETOROLAC TROMETHAMINE 30 MG/ML IJ SOLN
30.0000 mg | Freq: Once | INTRAMUSCULAR | Status: AC
  Administered 2014-12-11: 30 mg via INTRAVENOUS
  Filled 2014-12-11: qty 1

## 2014-12-11 NOTE — ED Provider Notes (Signed)
TIME SEEN: 8:45 PM  CHIEF COMPLAINT: Migraine  HPI: Pt is a 54 y.o. female with history of hypertension, DVT no longer on anticoagulation, migraines who presents to the emergency department with complaints of a headache for the past 3 days that is on the left side, throbbing with photophobia and nausea and vomiting that is similar to her prior headaches. States she is able to normally take ibuprofen at home with good relief. No numbness, tingling or focal weakness. No head injury. No fever, neck pain or neck stiffness. No rash. States migraine cocktails of helped her headaches in the past when she has had to come to the ED.  ROS: See HPI Constitutional: no fever  Eyes: no drainage  ENT: no runny nose   Cardiovascular:  no chest pain  Resp: no SOB  GI: no vomiting GU: no dysuria Integumentary: no rash  Allergy: no hives  Musculoskeletal: no leg swelling  Neurological: no slurred speech ROS otherwise negative  PAST MEDICAL HISTORY/PAST SURGICAL HISTORY:  Past Medical History  Diagnosis Date  . Hypertension   . DVT (deep vein thrombosis) in pregnancy   . Vitamin B 12 deficiency   . Blood transfusion   . Migraines     MEDICATIONS:  Prior to Admission medications   Medication Sig Start Date End Date Taking? Authorizing Provider  B Complex-C (SUPER B COMPLEX PO) Take 1 tablet by mouth daily.   Yes Historical Provider, MD  cloNIDine (CATAPRES) 0.1 MG tablet Take 0.1 mg by mouth 2 (two) times daily.   Yes Historical Provider, MD  cyanocobalamin (,VITAMIN B-12,) 1000 MCG/ML injection Inject 1,000 mcg into the muscle every 30 (thirty) days.   Yes Historical Provider, MD  diltiazem (CARDIZEM CD) 240 MG 24 hr capsule Take 240 mg by mouth every evening.   Yes Historical Provider, MD  ferrous sulfate 325 (65 FE) MG tablet Take 325 mg by mouth daily with breakfast.   Yes Historical Provider, MD  ibuprofen (ADVIL,MOTRIN) 200 MG tablet Take 800-1,600 mg by mouth every 6 (six) hours as needed for  headache or moderate pain.    Yes Historical Provider, MD  labetalol (NORMODYNE) 200 MG tablet Take 200 mg by mouth 2 (two) times daily.   Yes Historical Provider, MD  lisinopril (PRINIVIL,ZESTRIL) 20 MG tablet Take 20 mg by mouth every evening.   Yes Historical Provider, MD  pravastatin (PRAVACHOL) 80 MG tablet Take 80 mg by mouth every evening.   Yes Historical Provider, MD  acetaminophen (TYLENOL) 500 MG tablet Take 500 mg by mouth every 6 (six) hours as needed. For pain     Historical Provider, MD    ALLERGIES:  No Known Allergies  SOCIAL HISTORY:  History  Substance Use Topics  . Smoking status: Former Games developermoker  . Smokeless tobacco: Not on file  . Alcohol Use: No    FAMILY HISTORY: History reviewed. No pertinent family history.  EXAM: BP 205/103 mmHg  Pulse 64  Temp(Src) 99 F (37.2 C) (Oral)  Resp 20  Ht 5\' 5"  (1.651 m)  Wt 200 lb (90.719 kg)  BMI 33.28 kg/m2  SpO2 100% CONSTITUTIONAL: Alert and oriented and responds appropriately to questions. Well-appearing; well-nourished HEAD: Normocephalic EYES: Conjunctivae clear, PERRL, patient has photophobia ENT: normal nose; no rhinorrhea; moist mucous membranes; pharynx without lesions noted NECK: Supple, no meningismus, no LAD  CARD: RRR; S1 and S2 appreciated; no murmurs, no clicks, no rubs, no gallops RESP: Normal chest excursion without splinting or tachypnea; breath sounds clear and equal bilaterally;  no wheezes, no rhonchi, no rales,  ABD/GI: Normal bowel sounds; non-distended; soft, non-tender, no rebound, no guarding BACK:  The back appears normal and is non-tender to palpation, there is no CVA tenderness EXT: Normal ROM in all joints; non-tender to palpation; no edema; normal capillary refill; no cyanosis    SKIN: Normal color for age and race; warm NEURO: Moves all extremities equally; sensation to light touch intact diffusely, cranial nerves II through XII intact, normal gait PSYCH: The patient's mood and manner  are appropriate. Grooming and personal hygiene are appropriate.  MEDICAL DECISION MAKING: Patient here with migraine headache. No red flag symptoms. Neurologically intact. Afebrile without meningismus. Has had severe headaches in the past. Will treat with Toradol, Reglan, Benadryl, IV fluids and Decadron. I do not feel she needs head imaging at this time.  ED PROGRESS: Pt's HA is completely gone after migraine cocktail. We'll discharge with prescription for Fioricet to use as needed if her home ibuprofen does not help with her symptoms in the future. Have advised her to follow up with her primary care physician. Discussed return precautions and supportive care instructions. She verbalized understanding and is comfortable with plan.     Annette Maw Jonovan Boedecker, DO 12/11/14 2256

## 2014-12-11 NOTE — ED Notes (Signed)
Headache for 3 days,  N/v,  Light sensative.

## 2014-12-11 NOTE — Discharge Instructions (Signed)

## 2014-12-11 NOTE — ED Notes (Signed)
Pt states she has had a headache for the past 3 days. States unable to get under control using the normal OTC medications.

## 2014-12-11 NOTE — ED Notes (Signed)
Pt alert & oriented x4, stable gait. Patient  given discharge instructions, paperwork & prescription(s). Patient verbalized understanding. Pt left department w/ no further questions. 

## 2016-03-04 ENCOUNTER — Encounter (HOSPITAL_COMMUNITY): Payer: Self-pay

## 2016-03-04 ENCOUNTER — Emergency Department (HOSPITAL_COMMUNITY)
Admission: EM | Admit: 2016-03-04 | Discharge: 2016-03-05 | Disposition: A | Discharge status: Home / Self Care | Payer: No Typology Code available for payment source | Attending: Emergency Medicine | Admitting: Emergency Medicine

## 2016-03-04 ENCOUNTER — Emergency Department (HOSPITAL_COMMUNITY): Payer: No Typology Code available for payment source

## 2016-03-04 DIAGNOSIS — Z79899 Other long term (current) drug therapy: Secondary | ICD-10-CM | POA: Insufficient documentation

## 2016-03-04 DIAGNOSIS — I10 Essential (primary) hypertension: Secondary | ICD-10-CM | POA: Diagnosis not present

## 2016-03-04 DIAGNOSIS — R51 Headache: Secondary | ICD-10-CM | POA: Diagnosis not present

## 2016-03-04 DIAGNOSIS — E538 Deficiency of other specified B group vitamins: Secondary | ICD-10-CM | POA: Insufficient documentation

## 2016-03-04 DIAGNOSIS — Z87891 Personal history of nicotine dependence: Secondary | ICD-10-CM | POA: Insufficient documentation

## 2016-03-04 DIAGNOSIS — Z86718 Personal history of other venous thrombosis and embolism: Secondary | ICD-10-CM | POA: Insufficient documentation

## 2016-03-04 DIAGNOSIS — H53149 Visual discomfort, unspecified: Secondary | ICD-10-CM | POA: Diagnosis not present

## 2016-03-04 DIAGNOSIS — R112 Nausea with vomiting, unspecified: Secondary | ICD-10-CM | POA: Diagnosis not present

## 2016-03-04 DIAGNOSIS — R519 Headache, unspecified: Secondary | ICD-10-CM

## 2016-03-04 LAB — CBC WITH DIFFERENTIAL/PLATELET
BASOS ABS: 0 10*3/uL (ref 0.0–0.1)
BASOS PCT: 0 %
EOS PCT: 2 %
Eosinophils Absolute: 0.1 10*3/uL (ref 0.0–0.7)
HCT: 38.5 % (ref 36.0–46.0)
Hemoglobin: 12.3 g/dL (ref 12.0–15.0)
LYMPHS PCT: 23 %
Lymphs Abs: 1.7 10*3/uL (ref 0.7–4.0)
MCH: 25.8 pg — ABNORMAL LOW (ref 26.0–34.0)
MCHC: 31.9 g/dL (ref 30.0–36.0)
MCV: 80.7 fL (ref 78.0–100.0)
MONO ABS: 0.5 10*3/uL (ref 0.1–1.0)
Monocytes Relative: 6 %
NEUTROS ABS: 5 10*3/uL (ref 1.7–7.7)
Neutrophils Relative %: 69 %
PLATELETS: 194 10*3/uL (ref 150–400)
RBC: 4.77 MIL/uL (ref 3.87–5.11)
RDW: 14.4 % (ref 11.5–15.5)
WBC: 7.4 10*3/uL (ref 4.0–10.5)

## 2016-03-04 LAB — COMPREHENSIVE METABOLIC PANEL
ALBUMIN: 3.6 g/dL (ref 3.5–5.0)
ALT: 47 U/L (ref 14–54)
AST: 54 U/L — AB (ref 15–41)
Alkaline Phosphatase: 225 U/L — ABNORMAL HIGH (ref 38–126)
Anion gap: 12 (ref 5–15)
BUN: 15 mg/dL (ref 6–20)
CHLORIDE: 105 mmol/L (ref 101–111)
CO2: 23 mmol/L (ref 22–32)
CREATININE: 1.04 mg/dL — AB (ref 0.44–1.00)
Calcium: 9 mg/dL (ref 8.9–10.3)
GFR calc Af Amer: >60 mL/min (ref >60)
GFR calc non Af Amer: 59 mL/min — ABNORMAL LOW (ref >60)
GLUCOSE: 163 mg/dL — AB (ref 65–99)
POTASSIUM: 3.1 mmol/L — AB (ref 3.5–5.1)
Sodium: 140 mmol/L (ref 135–145)
Total Bilirubin: 0.5 mg/dL (ref 0.3–1.2)
Total Protein: 6.9 g/dL (ref 6.5–8.1)

## 2016-03-04 LAB — I-STAT TROPONIN, ED: Troponin i, poc: 0.01 ng/mL (ref 0.00–0.08)

## 2016-03-04 LAB — PROTIME-INR
INR: 1.02 (ref 0.00–1.49)
PROTHROMBIN TIME: 13.6 s (ref 11.6–15.2)

## 2016-03-04 MED ORDER — POTASSIUM CHLORIDE CRYS ER 20 MEQ PO TBCR
40.0000 meq | EXTENDED_RELEASE_TABLET | Freq: Once | ORAL | Status: AC
  Administered 2016-03-04: 40 meq via ORAL
  Filled 2016-03-04: qty 2

## 2016-03-04 MED ORDER — SODIUM CHLORIDE 0.9 % IV BOLUS (SEPSIS)
1000.0000 mL | Freq: Once | INTRAVENOUS | Status: AC
  Administered 2016-03-04: 1000 mL via INTRAVENOUS

## 2016-03-04 MED ORDER — ONDANSETRON 4 MG PO TBDP
4.0000 mg | ORAL_TABLET | Freq: Once | ORAL | Status: AC
  Administered 2016-03-04: 4 mg via ORAL

## 2016-03-04 MED ORDER — KETOROLAC TROMETHAMINE 30 MG/ML IJ SOLN
30.0000 mg | Freq: Once | INTRAMUSCULAR | Status: AC
  Administered 2016-03-04: 30 mg via INTRAVENOUS
  Filled 2016-03-04: qty 1

## 2016-03-04 MED ORDER — DEXAMETHASONE SODIUM PHOSPHATE 10 MG/ML IJ SOLN
10.0000 mg | Freq: Once | INTRAMUSCULAR | Status: AC
  Administered 2016-03-04: 10 mg via INTRAVENOUS
  Filled 2016-03-04: qty 1

## 2016-03-04 MED ORDER — ONDANSETRON HCL 4 MG/2ML IJ SOLN: 4.0000 mg | INTRAMUSCULAR | Status: DC

## 2016-03-04 MED ORDER — METOCLOPRAMIDE HCL 5 MG/ML IJ SOLN
10.0000 mg | Freq: Once | INTRAMUSCULAR | Status: AC
  Administered 2016-03-04: 10 mg via INTRAVENOUS
  Filled 2016-03-04: qty 2

## 2016-03-04 MED ORDER — ONDANSETRON 4 MG PO TBDP
ORAL_TABLET | ORAL | Status: AC
  Filled 2016-03-04: qty 1

## 2016-03-04 MED ORDER — DIPHENHYDRAMINE HCL 50 MG/ML IJ SOLN
12.5000 mg | Freq: Once | INTRAMUSCULAR | Status: AC
  Administered 2016-03-04: 12.5 mg via INTRAVENOUS
  Filled 2016-03-04: qty 1

## 2016-03-04 NOTE — ED Notes (Signed)
Melanie, RN present when blood pressure was taken

## 2016-03-04 NOTE — ED Provider Notes (Signed)
CSN: [id number]     Arrival date & time 03/04/16  1833 History   First MD Initiated Contact with Patient 03/04/16 1921     Chief Complaint  Patient presents with  . Migraine  . Emesis    HPI   Annette Cox is a 55 y.o. female with a PMH of HTN, DVT, migraines who presents to the ED with headache, which she states started last week and has been intermittent since that time. She notes her symptoms worsened yesterday and today, prompting her to come to the ED. She states she has been taking excedrin migraine with minimal symptom relief. She notes associated photophobia, nausea, and vomiting. She also reports "spots" in her vision, which she states she has had in the past with migraines. She notes she has a history of migraines and that her symptoms feel similar. She denies fever, chills, neck pain, numbness, weakness, paresthesia.   Past Medical History  Diagnosis Date  . Hypertension   . DVT (deep vein thrombosis) in pregnancy   . Vitamin B 12 deficiency   . Blood transfusion   . Migraines    Past Surgical History  Procedure Laterality Date  . Cesarean section    . Tubal ligation     No family history on file. Social History  Substance Use Topics  . Smoking status: Former Research scientist (life sciences)  . Smokeless tobacco: None  . Alcohol Use: No   OB History    No data available      Review of Systems  Constitutional: Negative for fever and chills.  Eyes: Positive for visual disturbance.  Gastrointestinal: Positive for nausea and vomiting.  Neurological: Positive for headaches. Negative for syncope, weakness and numbness.  All other systems reviewed and are negative.     Allergies  Review of patient's allergies indicates no known allergies.  Home Medications   Prior to Admission medications   Medication Sig Start Date End Date Taking? Authorizing Provider  acetaminophen (TYLENOL) 500 MG tablet Take 500 mg by mouth every 6 (six) hours as needed. For pain    Yes Historical Provider,  MD  aspirin-acetaminophen-caffeine (EXCEDRIN MIGRAINE) [phone] MG tablet Take 2 tablets by mouth every 6 (six) hours as needed for headache.   Yes Historical Provider, MD  B Complex-C (SUPER B COMPLEX PO) Take 1 tablet by mouth daily.   Yes Historical Provider, MD  ferrous sulfate 325 (65 FE) MG tablet Take 325 mg by mouth daily with breakfast.   Yes Historical Provider, MD  ibuprofen (ADVIL,MOTRIN) 200 MG tablet Take 800-1,600 mg by mouth every 6 (six) hours as needed for headache or moderate pain.    Yes Historical Provider, MD  labetalol (NORMODYNE) 200 MG tablet Take 200 mg by mouth 2 (two) times daily.   Yes Historical Provider, MD  lisinopril (PRINIVIL,ZESTRIL) 20 MG tablet Take 20 mg by mouth every evening.   Yes Historical Provider, MD  pravastatin (PRAVACHOL) 80 MG tablet Take 80 mg by mouth every evening.   Yes Historical Provider, MD  cyanocobalamin (,VITAMIN B-12,) 1000 MCG/ML injection Inject 1,000 mcg into the muscle every 30 (thirty) days.    Historical Provider, MD    BP 133/85 mmHg  Pulse 54  Temp(Src) 97.8 F (36.6 C) (Oral)  Resp 16  SpO2 98% Physical Exam  Constitutional: She is oriented to person, place, and time. She appears well-developed and well-nourished. No distress.  HENT:  Head: Normocephalic and atraumatic.  Right Ear: External ear normal.  Left Ear: External ear normal.  Nose: Nose normal.  Mouth/Throat: Uvula is midline, oropharynx is clear and moist and mucous membranes are normal.  Eyes: Conjunctivae, EOM and lids are normal. Pupils are equal, round, and reactive to light. Right eye exhibits no discharge. Left eye exhibits no discharge. No scleral icterus.  Neck: Normal range of motion. Neck supple.  Cardiovascular: Normal rate, regular rhythm, normal heart sounds, intact distal pulses and normal pulses.   Pulmonary/Chest: Effort normal and breath sounds normal. No respiratory distress. She has no wheezes. She has no rales.  Abdominal: Soft. Normal  appearance and bowel sounds are normal. She exhibits no distension and no mass. There is no tenderness. There is no rigidity, no rebound and no guarding.  Musculoskeletal: Normal range of motion. She exhibits no edema or tenderness.  Neurological: She is alert and oriented to person, place, and time. She has normal strength. No cranial nerve deficit or sensory deficit. Coordination normal.  Skin: Skin is warm, dry and intact. No rash noted. She is not diaphoretic. No erythema. No pallor.  Psychiatric: She has a normal mood and affect. Her speech is normal and behavior is normal.  Nursing note and vitals reviewed.   ED Course  Procedures (including critical care time)  Labs Review Labs Reviewed  CBC WITH DIFFERENTIAL/PLATELET - Abnormal; Notable for the following:    MCH 25.8 (*)    All other components within normal limits  COMPREHENSIVE METABOLIC PANEL - Abnormal; Notable for the following:    Potassium 3.1 (*)    Glucose, Bld 163 (*)    Creatinine, Ser 1.04 (*)    AST 54 (*)    Alkaline Phosphatase 225 (*)    GFR calc non Af Amer 59 (*)    All other components within normal limits  PROTIME-INR  I-STAT TROPOININ, ED    Imaging Review Ct Head Wo Contrast  03/04/2016  CLINICAL DATA:  Headache, nausea, vomiting and blurred vision. EXAM: CT HEAD WITHOUT CONTRAST TECHNIQUE: Contiguous axial images were obtained from the base of the skull through the vertex without intravenous contrast. COMPARISON:  None. FINDINGS: The brain demonstrates no evidence of hemorrhage, infarction, edema, mass effect, extra-axial fluid collection, hydrocephalus or mass lesion. The skull is unremarkable. IMPRESSION: Normal head CT. Electronically Signed   By: Aletta Edouard M.D.   On: 03/04/2016 19:42   I have personally reviewed and evaluated these images and lab results as part of my medical decision-making.   EKG Interpretation None      MDM   Final diagnoses:  Headache, unspecified headache type     55 year old female presents with headache. States her symptoms have been intermittent for the past week, though worsened yesterday. Reports she has a history of migraines and that her headache feels similar. He notes associated photophobia. Also reports nausea, vomiting, and vision changes, which she states she has experienced in the past with her migraines.   Patient is afebrile. Hypertensive. Normal neuro exam with no focal deficit. No nuchal rigidity. Patient moves all extremities and ambulates without difficulty.  CBC negative for leukocytosis or anemia. CMP remarkable for potassium 3.1, AST 54, alk phos 225. Patient given oral potassium. Head CT negative. Discussed findings with patient.  Patient given toradol, reglan, benadryl, decadron, fluids. On reassessment, she reports significant symptom improvement.   Patient is non-toxic and well-appearing, feel she is stable for discharge at this time. BP initially 314H systolic, however improved to 130s-140s throughout ED course. Patient to follow-up with PCP for recheck of AST and alkaline phosphatase. Strict  return precautions discussed. Patient verbalizes her understanding and is in agreement with plan.  BP 133/85 mmHg  Pulse 54  Temp(Src) 97.8 F (36.6 C) (Oral)  Resp 16  SpO2 98%     Marella Chimes, PA-C 03/05/16 2334  Charlesetta Shanks, MD 03/05/16 [phone]

## 2016-03-04 NOTE — ED Notes (Signed)
This RN spoke with Dr. Donnald GarrePfeiffer regarding pts symptoms: hypertension, migraine with emesis and etc. Orders: head Ct, Pt/inr, cardiac workup, EKG, cmp, cbc.

## 2016-03-04 NOTE — ED Notes (Addendum)
Pt reports migraine HA, onset one week ago but the pain has been constant since yesterday. She reports 3 episodes of emesis today and reports blurry vision. Hx of headaches but reports this HA is worse. A&OX4. Pt revealed yesterday her left arm felt numb but denies current numbness. Strong equal hand grasps bilaterally, clear speech, no neuro deficits.

## 2016-03-04 NOTE — ED Notes (Signed)
Pt called out to use the restroom, as RN entered she was at the door.  She reported that she was dizzy but was unable to be persuaded to wait for a bedside commode.  RN assisted her to the bathroom, used a wheelchair to get her back to her room.  She complained of dizziness, nausea and weakness.

## 2016-03-04 NOTE — ED Notes (Signed)
Pt taken to CT 2 by this RN

## 2016-03-04 NOTE — Discharge Instructions (Signed)
1. Medications: usual home medications - make sure you are taking your blood pressure medications 2. Treatment: rest, drink plenty of fluids 3. Follow Up: please followup with your primary doctor in 2-3 days for discussion of your diagnoses and further evaluation after today's visit; please return to the ER for new or worsening symptoms   Migraine Headache A migraine headache is very bad, throbbing pain on one or both sides of your head. Talk to your doctor about what things may bring on (trigger) your migraine headaches. HOME CARE  Only take medicines as told by your doctor.  Lie down in a dark, quiet room when you have a migraine.  Keep a journal to find out if certain things bring on migraine headaches. For example, write down:  What you eat and drink.  How much sleep you get.  Any change to your diet or medicines.  Lessen how much alcohol you drink.  Quit smoking if you smoke.  Get enough sleep.  Lessen any stress in your life.  Keep lights dim if bright lights bother you or make your migraines worse. GET HELP RIGHT AWAY IF:   Your migraine becomes really bad.  You have a fever.  You have a stiff neck.  You have trouble seeing.  Your muscles are weak, or you lose muscle control.  You lose your balance or have trouble walking.  You feel like you will pass out (faint), or you pass out.  You have really bad symptoms that are different than your first symptoms. MAKE SURE YOU:   Understand these instructions.  Will watch your condition.  Will get help right away if you are not doing well or get worse.   This information is not intended to replace advice given to you by your health care provider. Make sure you discuss any questions you have with your health care provider.   Document Released: 08/26/2008 Document Revised: 02/09/2012 Document Reviewed: 07/25/2013 Elsevier Interactive Patient Education [employer]2016 [employer]

## 2016-03-04 NOTE — ED Notes (Signed)
Pt ambulated in the hall without difficulty

## 2016-03-17 ENCOUNTER — Encounter (HOSPITAL_COMMUNITY): Payer: Self-pay | Admitting: Emergency Medicine

## 2016-03-17 ENCOUNTER — Emergency Department (HOSPITAL_COMMUNITY)
Admission: EM | Admit: 2016-03-17 | Discharge: 2016-03-18 | Disposition: A | Discharge status: Home / Self Care | Payer: No Typology Code available for payment source | Attending: Emergency Medicine | Admitting: Emergency Medicine

## 2016-03-17 DIAGNOSIS — Z79899 Other long term (current) drug therapy: Secondary | ICD-10-CM | POA: Insufficient documentation

## 2016-03-17 DIAGNOSIS — I10 Essential (primary) hypertension: Secondary | ICD-10-CM | POA: Insufficient documentation

## 2016-03-17 DIAGNOSIS — N390 Urinary tract infection, site not specified: Secondary | ICD-10-CM | POA: Insufficient documentation

## 2016-03-17 DIAGNOSIS — R1030 Lower abdominal pain, unspecified: Secondary | ICD-10-CM | POA: Diagnosis present

## 2016-03-17 DIAGNOSIS — Z87891 Personal history of nicotine dependence: Secondary | ICD-10-CM | POA: Diagnosis not present

## 2016-03-17 LAB — BASIC METABOLIC PANEL
ANION GAP: 10 (ref 5–15)
BUN: 16 mg/dL (ref 6–20)
CHLORIDE: 105 mmol/L (ref 101–111)
CO2: 26 mmol/L (ref 22–32)
CREATININE: 1.35 mg/dL — AB (ref 0.44–1.00)
Calcium: 8.8 mg/dL — ABNORMAL LOW (ref 8.9–10.3)
GFR calc non Af Amer: 43 mL/min — ABNORMAL LOW (ref >60)
GFR, EST AFRICAN AMERICAN: 50 mL/min — AB (ref >60)
Glucose, Bld: 140 mg/dL — ABNORMAL HIGH (ref 65–99)
POTASSIUM: 4.1 mmol/L (ref 3.5–5.1)
SODIUM: 141 mmol/L (ref 135–145)

## 2016-03-17 LAB — URINALYSIS, ROUTINE W REFLEX MICROSCOPIC
BILIRUBIN URINE: NEGATIVE
Glucose, UA: NEGATIVE mg/dL
Nitrite: POSITIVE — AB
PH: 6.5 (ref 5.0–8.0)
Protein, ur: 100 mg/dL — AB
SPECIFIC GRAVITY, URINE: 1.02 (ref 1.005–1.030)

## 2016-03-17 LAB — CBC
HCT: 37.3 % (ref 36.0–46.0)
Hemoglobin: 12.4 g/dL (ref 12.0–15.0)
MCH: 26.7 pg (ref 26.0–34.0)
MCHC: 33.2 g/dL (ref 30.0–36.0)
MCV: 80.4 fL (ref 78.0–100.0)
Platelets: 216 10*3/uL (ref 150–400)
RBC: 4.64 MIL/uL (ref 3.87–5.11)
RDW: 14.2 % (ref 11.5–15.5)
WBC: 9.5 10*3/uL (ref 4.0–10.5)

## 2016-03-17 LAB — URINE MICROSCOPIC-ADD ON

## 2016-03-17 NOTE — ED Notes (Addendum)
Patient complaining of right flank pain starting Friday. Also states she started vomiting today. States "I'm supposed to have an ultrasound of my liver on Thursday because my liver enzymes are too high." Also states she noticed blood in her urine since Friday.

## 2016-03-17 NOTE — ED Provider Notes (Signed)
CSN: [id number]     Arrival date & time 03/17/16  1940 History  By signing my name below, I, Annette Cox, attest that this documentation has been prepared under the direction and in the presence of Annette Albe, MD at 2307 PM . Electronically Signed: Phillis Cox, ED Scribe. 03/17/2016. 11:17 PM.   Chief Complaint  Patient presents with  . Flank Pain   The history is provided by the patient. No language interpreter was used.  HPI Comments: Annette Cox is a 55 y.o. female with a hx of HTN and DVT who presents to the Emergency Department complaining of right flank pain onset 4 days ago. Pt states that she was started on Crestor Friday, April 14th, then began to have darkened urine. She has since stopped taking the Crestor when she noticed the symptoms. She reports associated dysuria, frequency, lower abdominal pain, noticing blood when she wipes after urination, nausea, and vomiting x today. She reports worsening pain with sitting up straight and relief with laying down. She states that she has been drinking cranberry juice and water frequently to no relief. Pt has an appointment with her PCP to have an ultrasound of her liver. She denies hx of similar symptoms. She denies fever, chills, or diarrhea.   PCP Dr [name]  Past Medical History  Diagnosis Date  . Hypertension   . DVT (deep vein thrombosis) in pregnancy   . Vitamin B 12 deficiency   . Blood transfusion   . Migraines    Past Surgical History  Procedure Laterality Date  . Cesarean section    . Tubal ligation     History reviewed. No pertinent family history. Social History  Substance Use Topics  . Smoking status: Former Games developer  . Smokeless tobacco: None  . Alcohol Use: No   Lives at home Lives with spouse  OB History    No data available     Review of Systems  Gastrointestinal: Positive for vomiting.  Genitourinary: Positive for flank pain.       Blood on toilet paper from wiping after urination  All other  systems reviewed and are negative.  Allergies  Review of patient's allergies indicates no known allergies.  Home Medications   Prior to Admission medications   Medication Sig Start Date End Date Taking? Authorizing Provider  acetaminophen (TYLENOL) 500 MG tablet Take 500 mg by mouth every 6 (six) hours as needed. For pain     Historical Provider, MD  aspirin-acetaminophen-caffeine (EXCEDRIN MIGRAINE) [phone] MG tablet Take 2 tablets by mouth every 6 (six) hours as needed for headache.    Historical Provider, MD  B Complex-C (SUPER B COMPLEX PO) Take 1 tablet by mouth daily.    Historical Provider, MD  cephALEXin (KEFLEX) 500 MG capsule Take 1 capsule (500 mg total) by mouth 3 (three) times daily. 03/18/16   Annette Albe, MD  cyanocobalamin (,VITAMIN B-12,) 1000 MCG/ML injection Inject 1,000 mcg into the muscle every 30 (thirty) days.    Historical Provider, MD  ferrous sulfate 325 (65 FE) MG tablet Take 325 mg by mouth daily with breakfast.    Historical Provider, MD  ibuprofen (ADVIL,MOTRIN) 200 MG tablet Take 800-1,600 mg by mouth every 6 (six) hours as needed for headache or moderate pain.     Historical Provider, MD  labetalol (NORMODYNE) 200 MG tablet Take 200 mg by mouth 2 (two) times daily.    Historical Provider, MD  lisinopril (PRINIVIL,ZESTRIL) 20 MG tablet Take 20 mg by mouth every evening.  Historical Provider, MD  ondansetron (ZOFRAN) 4 MG tablet Take 1 tablet (4 mg total) by mouth every 8 (eight) hours as needed for nausea or vomiting. 03/18/16   Annette Albe, MD  phenazopyridine (PYRIDIUM) 200 MG tablet Take 1 tablet (200 mg total) by mouth 3 (three) times daily. 03/18/16   Annette Albe, MD  pravastatin (PRAVACHOL) 80 MG tablet Take 80 mg by mouth every evening.    Historical Provider, MD   BP 136/77 mmHg  Pulse 81  Temp(Src) 98.4 F (36.9 C) (Oral)  Resp 18  Ht  (1.651 m)  Wt 200 lb (90.719 kg)  BMI 33.28 kg/m2  SpO2 99%  Vital signs normal   Physical Exam   Constitutional: She is oriented to person, place, and time. She appears well-developed and well-nourished.  Non-toxic appearance. She does not appear ill. No distress.  HENT:  Head: Normocephalic and atraumatic.  Right Ear: External ear normal.  Left Ear: External ear normal.  Nose: Nose normal. No mucosal edema or rhinorrhea.  Mouth/Throat: Oropharynx is clear and moist and mucous membranes are normal. No dental abscesses or uvula swelling.  Eyes: Conjunctivae and EOM are normal. Pupils are equal, round, and reactive to light.  Neck: Normal range of motion and full passive range of motion without pain. Neck supple.  Cardiovascular: Normal rate, regular rhythm and normal heart sounds.  Exam reveals no gallop and no friction rub.   No murmur heard. Pulmonary/Chest: Effort normal and breath sounds normal. No respiratory distress. She has no wheezes. She has no rhonchi. She has no rales. She exhibits no tenderness and no crepitus.  Abdominal: Soft. Normal appearance and bowel sounds are normal. She exhibits no distension. There is tenderness in the suprapubic area. There is no rebound, no guarding and no CVA tenderness.    Genitourinary:  No CVA tenderness bilaterally  Musculoskeletal: Normal range of motion. She exhibits no edema or tenderness.  Moves all extremities well.   Neurological: She is alert and oriented to person, place, and time. She has normal strength. No cranial nerve deficit.  Skin: Skin is warm, dry and intact. No rash noted. No erythema. No pallor.  Psychiatric: She has a normal mood and affect. Her speech is normal and behavior is normal. Her mood appears not anxious.  Nursing note and vitals reviewed.   ED Course  Procedures (including critical care time)  Medications  cephALEXin (KEFLEX) capsule 500 mg (500 mg Oral Given 03/18/16 0030)  phenazopyridine (PYRIDIUM) tablet 200 mg (200 mg Oral Given 03/18/16 0030)    DIAGNOSTIC STUDIES: Oxygen Saturation is 99% on RA,  normal by my interpretation.    COORDINATION OF CARE: 11:13 PM-Discussed treatment plan which includes labs and antibiotics with pt at bedside and pt agreed to plan.   CK was added to her blood work already done by nursing staff to make sure she did not have rhabdomyolysis from her statin.  Patient was started on antibiotics and Pyridium for her urinary discomfort.   Labs Review Results for orders placed or performed during the hospital encounter of 03/17/16  Urinalysis, Routine w reflex microscopic-may I&O cath if menses (not at [facility])  Result Value Ref Range   Color, Urine YELLOW YELLOW   APPearance HAZY (A) CLEAR   Specific Gravity, Urine 1.020 1.005 - 1.030   pH 6.5 5.0 - 8.0   Glucose, UA NEGATIVE NEGATIVE mg/dL   Hgb urine dipstick SMALL (A) NEGATIVE   Bilirubin Urine NEGATIVE NEGATIVE   Ketones, ur TRACE (A)  NEGATIVE mg/dL   Protein, ur 161100 (A) NEGATIVE mg/dL   Nitrite POSITIVE (A) NEGATIVE   Leukocytes, UA TRACE (A) NEGATIVE  CBC  Result Value Ref Range   WBC 9.5 4.0 - 10.5 K/uL   RBC 4.64 3.87 - 5.11 MIL/uL   Hemoglobin 12.4 12.0 - 15.0 g/dL   HCT 09.637.3 04.536.0 - 40.946.0 %   MCV 80.4 78.0 - 100.0 fL   MCH 26.7 26.0 - 34.0 pg   MCHC 33.2 30.0 - 36.0 g/dL   RDW 81.114.2 91.411.5 - 78.215.5 %   Platelets 216 150 - 400 K/uL  Basic metabolic panel  Result Value Ref Range   Sodium 141 135 - 145 mmol/L   Potassium 4.1 3.5 - 5.1 mmol/L   Chloride 105 101 - 111 mmol/L   CO2 26 22 - 32 mmol/L   Glucose, Bld 140 (H) 65 - 99 mg/dL   BUN 16 6 - 20 mg/dL   Creatinine, Ser 9.561.35 (H) 0.44 - 1.00 mg/dL   Calcium 8.8 (L) 8.9 - 10.3 mg/dL   GFR calc non Af Amer 43 (L) >60 mL/min   GFR calc Af Amer 50 (L) >60 mL/min   Anion gap 10 5 - 15  Urine microscopic-add on  Result Value Ref Range   Squamous Epithelial / LPF 0-5 (A) NONE SEEN   WBC, UA 6-30 0 - 5 WBC/hpf   RBC / HPF 0-5 0 - 5 RBC/hpf   Bacteria, UA MANY (A) NONE SEEN  CK  Result Value Ref Range   Total CK 271 (H) 38 - 234 U/L    Laboratory interpretation all normal except UTI     MDM   Final diagnoses:  UTI (lower urinary tract infection)  Suprapubic abdominal pain, unspecified laterality    Discharge Medication List as of 03/18/2016 12:27 AM    START taking these medications   Details  cephALEXin (KEFLEX) 500 MG capsule Take 1 capsule (500 mg total) by mouth 3 (three) times daily., Starting 03/18/2016, Until Discontinued, Print    ondansetron (ZOFRAN) 4 MG tablet Take 1 tablet (4 mg total) by mouth every 8 (eight) hours as needed for nausea or vomiting., Starting 03/18/2016, Until Discontinued, Print    phenazopyridine (PYRIDIUM) 200 MG tablet Take 1 tablet (200 mg total) by mouth 3 (three) times daily., Starting 03/18/2016, Until Discontinued, Print        Plan discharge  Annette AlbeIva Lewi Drost, MD, FACEP   I personally performed the services described in this documentation, which was scribed in my presence. The recorded information has been reviewed and considered.  Annette AlbeIva Artemis Koller, MD, Concha PyoFACEP    Jaria Conway, MD 03/18/16 [phone]

## 2016-03-18 LAB — CK: Total CK: 271 U/L — ABNORMAL HIGH (ref 38–234)

## 2016-03-18 MED ORDER — CEPHALEXIN 500 MG PO CAPS
500.0000 mg | ORAL_CAPSULE | Freq: Once | ORAL | Status: AC
  Administered 2016-03-18: 500 mg via ORAL
  Filled 2016-03-18: qty 1

## 2016-03-18 MED ORDER — PHENAZOPYRIDINE HCL 100 MG PO TABS
200.0000 mg | ORAL_TABLET | Freq: Once | ORAL | Status: AC
  Administered 2016-03-18: 200 mg via ORAL
  Filled 2016-03-18: qty 2

## 2016-03-18 MED ORDER — CEPHALEXIN 500 MG PO CAPS: 500.0000 mg | ORAL_CAPSULE | Freq: Three times a day (TID) | ORAL | Status: AC

## 2016-03-18 MED ORDER — PHENAZOPYRIDINE HCL 200 MG PO TABS: 200.0000 mg | ORAL_TABLET | Freq: Three times a day (TID) | ORAL | Status: AC

## 2016-03-18 MED ORDER — ONDANSETRON HCL 4 MG PO TABS: 4.0000 mg | ORAL_TABLET | Freq: Three times a day (TID) | ORAL | Status: AC | PRN

## 2016-03-18 NOTE — Discharge Instructions (Signed)
Drink plenty of fluids. Take the antibiotics until gone. The pyridium will help with the discomfort of urinating and the frequency of urination. Recheck if you get a fever, the vomiting doesn't get better or you are getting worse instead of better over the next couple of days.    Urinary Tract Infection Urinary tract infections (UTIs) can develop anywhere along your urinary tract. Your urinary tract is your body's drainage system for removing wastes and extra water. Your urinary tract includes two kidneys, two ureters, a bladder, and a urethra. Your kidneys are a pair of bean-shaped organs. Each kidney is about the size of your fist. They are located below your ribs, one on each side of your spine. CAUSES Infections are caused by microbes, which are microscopic organisms, including fungi, viruses, and bacteria. These organisms are so small that they can only be seen through a microscope. Bacteria are the microbes that most commonly cause UTIs. SYMPTOMS  Symptoms of UTIs may vary by age and gender of the patient and by the location of the infection. Symptoms in young women typically include a frequent and intense urge to urinate and a painful, burning feeling in the bladder or urethra during urination. Older women and men are more likely to be tired, shaky, and weak and have muscle aches and abdominal pain. A fever may mean the infection is in your kidneys. Other symptoms of a kidney infection include pain in your back or sides below the ribs, nausea, and vomiting. DIAGNOSIS To diagnose a UTI, your caregiver will ask you about your symptoms. Your caregiver will also ask you to provide a urine sample. The urine sample will be tested for bacteria and white blood cells. White blood cells are made by your body to help fight infection. TREATMENT  Typically, UTIs can be treated with medication. Because most UTIs are caused by a bacterial infection, they usually can be treated with the use of antibiotics. The  choice of antibiotic and length of treatment depend on your symptoms and the type of bacteria causing your infection. HOME CARE INSTRUCTIONS  If you were prescribed antibiotics, take them exactly as your caregiver instructs you. Finish the medication even if you feel better after you have only taken some of the medication.  Drink enough water and fluids to keep your urine clear or pale yellow.  Avoid caffeine, tea, and carbonated beverages. They tend to irritate your bladder.  Empty your bladder often. Avoid holding urine for long periods of time.  Empty your bladder before and after sexual intercourse.  After a bowel movement, women should cleanse from front to back. Use each tissue only once. SEEK MEDICAL CARE IF:   You have back pain.  You develop a fever.  Your symptoms do not begin to resolve within 3 days. SEEK IMMEDIATE MEDICAL CARE IF:   You have severe back pain or lower abdominal pain.  You develop chills.  You have nausea or vomiting.  You have continued burning or discomfort with urination. MAKE SURE YOU:   Understand these instructions.  Will watch your condition.  Will get help right away if you are not doing well or get worse.   This information is not intended to replace advice given to you by your health care provider. Make sure you discuss any questions you have with your health care provider.   Document Released: 08/27/2005 Document Revised: 08/08/2015 Document Reviewed: 12/26/2011 Elsevier Interactive Patient Education [employer]2016 [employer] Inc.

## 2016-03-18 NOTE — ED Notes (Signed)
Pt alert & oriented x4, stable gait. Patient given discharge instructions, paperwork & prescription(s). Patient  instructed to stop at the registration desk to finish any additional paperwork. Patient verbalized understanding. Pt left department w/ no further questions. 

## 2017-05-19 ENCOUNTER — Other Ambulatory Visit (HOSPITAL_COMMUNITY): Payer: Self-pay | Admitting: Internal Medicine

## 2017-05-19 DIAGNOSIS — IMO0002 Reserved for concepts with insufficient information to code with codable children: Secondary | ICD-10-CM

## 2017-05-19 DIAGNOSIS — R229 Localized swelling, mass and lump, unspecified: Principal | ICD-10-CM

## 2017-05-20 ENCOUNTER — Other Ambulatory Visit (HOSPITAL_COMMUNITY): Payer: Self-pay | Admitting: Internal Medicine

## 2017-05-20 DIAGNOSIS — IMO0002 Reserved for concepts with insufficient information to code with codable children: Secondary | ICD-10-CM

## 2017-05-20 DIAGNOSIS — R229 Localized swelling, mass and lump, unspecified: Principal | ICD-10-CM

## 2017-05-26 ENCOUNTER — Ambulatory Visit (HOSPITAL_COMMUNITY)
Admission: RE | Admit: 2017-05-26 | Discharge: 2017-05-26 | Disposition: A | Discharge status: Home / Self Care | Payer: No Typology Code available for payment source | Source: Ambulatory Visit | Attending: Family Medicine | Admitting: Family Medicine

## 2017-05-26 ENCOUNTER — Other Ambulatory Visit (HOSPITAL_COMMUNITY): Payer: Self-pay | Admitting: Family Medicine

## 2017-05-26 DIAGNOSIS — R229 Localized swelling, mass and lump, unspecified: Secondary | ICD-10-CM

## 2017-05-26 DIAGNOSIS — IMO0002 Reserved for concepts with insufficient information to code with codable children: Secondary | ICD-10-CM

## 2017-05-26 DIAGNOSIS — N641 Fat necrosis of breast: Secondary | ICD-10-CM | POA: Insufficient documentation

## 2017-05-26 DIAGNOSIS — N6320 Unspecified lump in the left breast, unspecified quadrant: Secondary | ICD-10-CM | POA: Diagnosis present

## 2017-06-02 ENCOUNTER — Encounter (HOSPITAL_COMMUNITY): Payer: No Typology Code available for payment source

## 2018-09-17 ENCOUNTER — Encounter (HOSPITAL_COMMUNITY): Payer: Self-pay

## 2018-09-17 ENCOUNTER — Other Ambulatory Visit: Payer: Self-pay

## 2018-09-17 ENCOUNTER — Ambulatory Visit (HOSPITAL_COMMUNITY)
Admission: RE | Admit: 2018-09-17 | Payer: No Typology Code available for payment source | Source: Ambulatory Visit | Admitting: Ophthalmology

## 2018-09-17 ENCOUNTER — Encounter (HOSPITAL_COMMUNITY)
Admission: RE | Admit: 2018-09-17 | Discharge: 2018-09-17 | Disposition: A | Discharge status: Home / Self Care | Payer: No Typology Code available for payment source | Source: Ambulatory Visit | Attending: Ophthalmology | Admitting: Ophthalmology

## 2018-09-17 DIAGNOSIS — Z01818 Encounter for other preprocedural examination: Secondary | ICD-10-CM | POA: Diagnosis not present

## 2018-09-17 DIAGNOSIS — I10 Essential (primary) hypertension: Secondary | ICD-10-CM | POA: Insufficient documentation

## 2018-09-17 HISTORY — DX: Sickle-cell trait: D57.3

## 2018-09-17 LAB — BASIC METABOLIC PANEL
Anion gap: 8 (ref 5–15)
BUN: 12 mg/dL (ref 6–20)
CALCIUM: 8.8 mg/dL — AB (ref 8.9–10.3)
CO2: 27 mmol/L (ref 22–32)
Chloride: 108 mmol/L (ref 98–111)
Creatinine, Ser: 1 mg/dL (ref 0.44–1.00)
GFR calc Af Amer: >60 mL/min (ref >60)
Glucose, Bld: 97 mg/dL (ref 70–99)
Potassium: 3 mmol/L — ABNORMAL LOW (ref 3.5–5.1)
Sodium: 143 mmol/L (ref 135–145)

## 2018-09-17 LAB — CBC
HEMATOCRIT: 38.7 % (ref 36.0–46.0)
Hemoglobin: 12.3 g/dL (ref 12.0–15.0)
MCH: 26.2 pg (ref 26.0–34.0)
MCHC: 31.8 g/dL (ref 30.0–36.0)
MCV: 82.3 fL (ref 80.0–100.0)
PLATELETS: 236 10*3/uL (ref 150–400)
RBC: 4.7 MIL/uL (ref 3.87–5.11)
RDW: 13.8 % (ref 11.5–15.5)
WBC: 5.2 10*3/uL (ref 4.0–10.5)
nRBC: 0 % (ref 0.0–0.2)

## 2018-09-17 NOTE — Patient Instructions (Signed)
Your procedure is scheduled on: 09/27/2018  Report to [facility] at   0800  AM.  Call this number if you have problems the morning of surgery: [phone]   Do not eat food or drink liquids :After Midnight.      Take these medicines the morning of surgery with A SIP OF WATER: labetolol, lisinopril, zofran, pyridium.   Do not wear jewelry, make-up or nail polish.  Do not wear lotions, powders, or perfumes. You may wear deodorant.  Do not shave 48 hours prior to surgery.  Do not bring valuables to the hospital.  Contacts, dentures or bridgework may not be worn into surgery.  Leave suitcase in the car. After surgery it may be brought to your room.  For patients admitted to the hospital, checkout time is 11:00 AM the day of discharge.   Patients discharged the day of surgery will not be allowed to drive home.  :     Please read over the following fact sheets that you were given: Coughing and Deep Breathing, Surgical Site Infection Prevention, Anesthesia Post-op Instructions and Care and Recovery After Surgery    Cataract A cataract is a clouding of the lens of the eye. When a lens becomes cloudy, vision is reduced based on the degree and nature of the clouding. Many cataracts reduce vision to some degree. Some cataracts make people more near-sighted as they develop. Other cataracts increase glare. Cataracts that are ignored and become worse can sometimes look white. The white color can be seen through the pupil. CAUSES   Aging. However, cataracts may occur at any age, even in newborns.   Certain drugs.   Trauma to the eye.   Certain diseases such as diabetes.   Specific eye diseases such as chronic inflammation inside the eye or a sudden attack of a rare form of glaucoma.   Inherited or acquired medical problems.  SYMPTOMS   Gradual, progressive drop in vision in the affected eye.   Severe, rapid visual loss. This most often happens when trauma is the cause.  DIAGNOSIS  To  detect a cataract, an eye doctor examines the lens. Cataracts are best diagnosed with an exam of the eyes with the pupils enlarged (dilated) by drops.  TREATMENT  For an early cataract, vision may improve by using different eyeglasses or stronger lighting. If that does not help your vision, surgery is the only effective treatment. A cataract needs to be surgically removed when vision loss interferes with your everyday activities, such as driving, reading, or watching TV. A cataract may also have to be removed if it prevents examination or treatment of another eye problem. Surgery removes the cloudy lens and usually replaces it with a substitute lens (intraocular lens, IOL).  At a time when both you and your doctor agree, the cataract will be surgically removed. If you have cataracts in both eyes, only one is usually removed at a time. This allows the operated eye to heal and be out of danger from any possible problems after surgery (such as infection or poor wound healing). In rare cases, a cataract may be doing damage to your eye. In these cases, your caregiver may advise surgical removal right away. The vast majority of people who have cataract surgery have better vision afterward. HOME CARE INSTRUCTIONS  If you are not planning surgery, you may be asked to do the following:  Use different eyeglasses.   Use stronger or brighter lighting.   Ask your eye doctor about reducing  your medicine dose or changing medicines if it is thought that a medicine caused your cataract. Changing medicines does not make the cataract go away on its own.   Become familiar with your surroundings. Poor vision can lead to injury. Avoid bumping into things on the affected side. You are at a higher risk for tripping or falling.   Exercise extreme care when driving or operating machinery.   Wear sunglasses if you are sensitive to bright light or experiencing problems with glare.  SEEK IMMEDIATE MEDICAL CARE IF:   You have  a worsening or sudden vision loss.   You notice redness, swelling, or increasing pain in the eye.   You have a fever.  Document Released: 11/17/2005 Document Revised: 11/06/2011 Document Reviewed: 07/11/2011 [facility] Patient Information 2012 [place].PATIENT INSTRUCTIONS POST-ANESTHESIA  IMMEDIATELY FOLLOWING SURGERY:  Do not drive or operate machinery for the first twenty four hours after surgery.  Do not make any important decisions for twenty four hours after surgery or while taking narcotic pain medications or sedatives.  If you develop intractable nausea and vomiting or a severe headache please notify your doctor immediately.  FOLLOW-UP:  Please make an appointment with your surgeon as instructed. You do not need to follow up with anesthesia unless specifically instructed to do so.  WOUND CARE INSTRUCTIONS (if applicable):  Keep a dry clean dressing on the anesthesia/puncture wound site if there is drainage.  Once the wound has quit draining you may leave it open to air.  Generally you should leave the bandage intact for twenty four hours unless there is drainage.  If the epidural site drains for more than 36-48 hours please call the anesthesia department.  QUESTIONS?:  Please feel free to call your physician or the hospital operator if you have any questions, and they will be happy to assist you.

## 2018-09-17 NOTE — Pre-Procedure Instructions (Signed)
Blood pressure 170/89 today. Patient states that "my blood pressure is always high". She is on 3 medications for BP. Patient is to go by Dr Otilio Saber office and have a consult with him concerning her blood pressure.

## 2018-09-27 ENCOUNTER — Encounter (HOSPITAL_COMMUNITY): Admission: RE | Payer: Self-pay | Source: Ambulatory Visit

## 2018-09-27 SURGERY — PHACOEMULSIFICATION, CATARACT, WITH IOL INSERTION
Anesthesia: Monitor Anesthesia Care | Site: Eye | Laterality: Right

## 2019-09-21 ENCOUNTER — Other Ambulatory Visit: Payer: Self-pay | Admitting: *Deleted

## 2019-09-21 DIAGNOSIS — Z20822 Contact with and (suspected) exposure to covid-19: Secondary | ICD-10-CM

## 2019-09-22 LAB — NOVEL CORONAVIRUS, NAA: SARS-CoV-2, NAA: NOT DETECTED

## 2019-09-30 ENCOUNTER — Telehealth: Payer: Self-pay | Admitting: Family Medicine

## 2019-09-30 NOTE — Telephone Encounter (Signed)
Negative COVID results given. Patient results "NOT Detected." Caller expressed understanding. ° °

## 2020-03-22 ENCOUNTER — Ambulatory Visit: Payer: No Typology Code available for payment source | Attending: Internal Medicine

## 2020-03-22 DIAGNOSIS — Z23 Encounter for immunization: Secondary | ICD-10-CM

## 2020-03-22 NOTE — Progress Notes (Signed)
   Covid-19 Vaccination Clinic  Name:  Annette Cox    MRN: [id number] DOB: 1961-04-13  03/22/2020  [name] was observed post Covid-19 immunization for 15 minutes without incident. She was provided with Vaccine Information Sheet and instruction to access the V-Safe system.   [name] was instructed to call 911 with any severe reactions post vaccine: [name] Difficulty breathing  . Swelling of face and throat  . A fast heartbeat  . A bad rash all over body  . Dizziness and weakness   Immunizations Administered    Name Date Dose VIS Date Route   Moderna COVID-19 Vaccine 03/22/2020  9:00 AM 0.5 mL 11/2019 Intramuscular   Manufacturer: Moderna   Lot: [id number]   NDC: 18288-337-44

## 2020-04-24 ENCOUNTER — Ambulatory Visit: Payer: No Typology Code available for payment source | Attending: Internal Medicine

## 2020-04-24 ENCOUNTER — Ambulatory Visit: Payer: No Typology Code available for payment source

## 2020-04-24 DIAGNOSIS — Z23 Encounter for immunization: Secondary | ICD-10-CM

## 2020-04-24 NOTE — Progress Notes (Signed)
   Covid-19 Vaccination Clinic  Name:  CHERILYN SAUTTER    MRN: [id number] DOB: 15-Jul-1961  04/24/2020  Ms. Kueker was observed post Covid-19 immunization for 15 minutes without incident. She was provided with Vaccine Information Sheet and instruction to access the V-Safe system.   Ms. Younis was instructed to call 911 with any severe reactions post vaccine: [name] Difficulty breathing  . Swelling of face and throat  . A fast heartbeat  . A bad rash all over body  . Dizziness and weakness   Immunizations Administered    Name Date Dose VIS Date Route   Moderna COVID-19 Vaccine 04/24/2020  9:14 AM 0.5 mL 11/2019 Intramuscular   Manufacturer: Moderna   Lot: [id number]   NDC: 48323-468-87

## 2021-06-10 ENCOUNTER — Other Ambulatory Visit: Payer: Self-pay

## 2021-06-10 ENCOUNTER — Emergency Department (HOSPITAL_COMMUNITY)
Admission: EM | Admit: 2021-06-10 | Discharge: 2021-06-10 | Disposition: A | Discharge status: Home / Self Care | Payer: No Typology Code available for payment source | Attending: Emergency Medicine | Admitting: Emergency Medicine

## 2021-06-10 ENCOUNTER — Emergency Department (HOSPITAL_COMMUNITY): Payer: No Typology Code available for payment source

## 2021-06-10 DIAGNOSIS — M109 Gout, unspecified: Secondary | ICD-10-CM

## 2021-06-10 DIAGNOSIS — Z79899 Other long term (current) drug therapy: Secondary | ICD-10-CM | POA: Diagnosis not present

## 2021-06-10 DIAGNOSIS — Z7982 Long term (current) use of aspirin: Secondary | ICD-10-CM | POA: Diagnosis not present

## 2021-06-10 DIAGNOSIS — Z87891 Personal history of nicotine dependence: Secondary | ICD-10-CM | POA: Insufficient documentation

## 2021-06-10 DIAGNOSIS — M79675 Pain in left toe(s): Secondary | ICD-10-CM | POA: Diagnosis present

## 2021-06-10 DIAGNOSIS — I10 Essential (primary) hypertension: Secondary | ICD-10-CM | POA: Insufficient documentation

## 2021-06-10 MED ORDER — PREDNISONE 50 MG PO TABS
60.0000 mg | ORAL_TABLET | Freq: Once | ORAL | Status: AC
  Administered 2021-06-10: 60 mg via ORAL
  Filled 2021-06-10: qty 1

## 2021-06-10 MED ORDER — HYDROCODONE-ACETAMINOPHEN 5-325 MG PO TABS: 1.0000 | ORAL_TABLET | ORAL | 0 refills | Status: AC | PRN

## 2021-06-10 MED ORDER — PREDNISONE 20 MG PO TABS: 20.0000 mg | ORAL_TABLET | Freq: Two times a day (BID) | ORAL | 0 refills | Status: AC

## 2021-06-10 NOTE — Discharge Instructions (Addendum)
It appears that he has gout of the left big toe.  We are prescribing prednisone to help that.  We also giving a prescription for pain reliever but do not drive or drink alcohol when taking it.  Elevate your left foot above your heart is much as possible to improve the pain.  Start taking the prednisone prescription tomorrow.  Take your blood pressure medicine as soon as you get home tonight.  Your blood pressure is very high tonight.  Return here, if needed.  Your doctor will need to recheck your blood pressure in a week or so to make sure it has improved.

## 2021-06-10 NOTE — ED Triage Notes (Signed)
Pt reports left foot pain x3 days. Pain swelling and redness today. Denies injury, rom intact. Pt reports that she has not taken her bp meds today. Pt is hypertensive in triage but denies cp or sob, no pounding headache.

## 2021-06-10 NOTE — ED Triage Notes (Signed)
Pt reports she does not have bp meds with her.

## 2021-06-10 NOTE — ED Provider Notes (Signed)
[facility] EMERGENCY DEPARTMENT Provider Note   CSN: [id number] Arrival date & time: 06/10/21  1909     History Chief Complaint  Patient presents with   Foot Pain    Annette Cox is a 60 y.o. female.  HPI She reports onset of left big toe pain, several days ago without trauma.  She is worried about gout because her mother has the same thing.  She did not take her blood pressure medicines today.  She denies headache, chest pain, shortness of breath, nausea, vomiting, blurred vision, paresthesias or weakness.  There are no other known acute modifying factors    Past Medical History:  Diagnosis Date   Blood transfusion    DVT (deep vein thrombosis) in pregnancy    Hypertension    Migraines    Sickle cell trait (HCC)    Vitamin B 12 deficiency     Patient Active Problem List   Diagnosis Date Noted   HYPERTENSION, UNSPECIFIED 05/30/2009   CAROTID ARTERY DISEASE 05/30/2009   ABNORMAL EKG 05/30/2009    Past Surgical History:  Procedure Laterality Date   CESAREAN SECTION     x2   TUBAL LIGATION       OB History   No obstetric history on file.     No family history on file.  Social History   Tobacco Use   Smoking status: Former    Packs/day: 0.30    Years: 10.00    Pack years: 3.00    Types: Cigarettes    Quit date: 09/17/1989    Years since quitting: 31.7   Smokeless tobacco: Never  Vaping Use   Vaping Use: Never used  Substance Use Topics   Alcohol use: No   Drug use: No    Home Medications Prior to Admission medications   Medication Sig Start Date End Date Taking? Authorizing Provider  acetaminophen (TYLENOL) 500 MG tablet Take 500 mg by mouth every 6 (six) hours as needed for moderate pain.    Yes [provider]  aspirin-acetaminophen-caffeine (EXCEDRIN MIGRAINE) [phone] MG tablet Take 2 tablets by mouth every 6 (six) hours as needed for headache.   Yes [provider]  cyanocobalamin (,VITAMIN B-12,) 1000 MCG/ML  injection Inject 1,000 mcg into the muscle every 30 (thirty) days.   Yes [provider]  ferrous sulfate 325 (65 FE) MG tablet Take 325 mg by mouth daily with breakfast.   Yes [provider]  HYDROcodone-acetaminophen (NORCO) 5-325 MG tablet Take 1 tablet by mouth every 4 (four) hours as needed. 06/10/21  Yes Mancel Bale, MD  ibuprofen (ADVIL,MOTRIN) 200 MG tablet Take 400 mg by mouth every 6 (six) hours as needed for headache or moderate pain.    Yes [provider]  labetalol (NORMODYNE) 300 MG tablet Take 300 mg by mouth daily.    Yes [provider]  predniSONE (DELTASONE) 20 MG tablet Take 1 tablet (20 mg total) by mouth 2 (two) times daily. 06/10/21  Yes Mancel Bale, MD  amLODipine (NORVASC) 5 MG tablet Take 5 mg by mouth daily. Patient not taking: Reported on 06/10/2021    [provider]  amLODIPine Besylate (NORVASC PO) amlodipine  10 mg by mouth daily Patient not taking: No sig reported    [provider]  cephALEXin (KEFLEX) 500 MG capsule Take 1 capsule (500 mg total) by mouth 3 (three) times daily. Patient not taking: Reported on 09/16/2018 03/18/16   Devoria Albe, MD  lisinopril (PRINIVIL,ZESTRIL) 20 MG tablet Take 20  mg by mouth every evening. Patient not taking: Reported on 06/10/2021    [provider]  ondansetron (ZOFRAN) 4 MG tablet Take 1 tablet (4 mg total) by mouth every 8 (eight) hours as needed for nausea or vomiting. Patient not taking: Reported on 09/16/2018 03/18/16   Devoria Albe, MD  phenazopyridine (PYRIDIUM) 200 MG tablet Take 1 tablet (200 mg total) by mouth 3 (three) times daily. Patient not taking: Reported on 09/16/2018 03/18/16   Devoria Albe, MD  rosuvastatin (CRESTOR) 20 MG tablet Take 20 mg by mouth daily. Patient not taking: Reported on 06/10/2021    [provider]    Allergies    Nitrofurantoin monohyd macro  Review of Systems   Review of Systems  All other systems reviewed and are  negative.  Physical Exam Updated Vital Signs BP (!) 224/114   Pulse 71   Temp 99.3 F (37.4 C) (Oral)   Resp 18   Ht 5\' 5"  (1.651 m)   Wt 85.7 kg   SpO2 98%   BMI 31.45 kg/m   Physical Exam Vitals and nursing note reviewed.  Constitutional:      General: She is not in acute distress.    Appearance: She is well-developed. She is not ill-appearing, toxic-appearing or diaphoretic.  HENT:     Head: Normocephalic and atraumatic.  Eyes:     Conjunctiva/sclera: Conjunctivae normal.     Pupils: Pupils are equal, round, and reactive to light.  Neck:     Trachea: Phonation normal.  Cardiovascular:     Rate and Rhythm: Normal rate.  Pulmonary:     Effort: Pulmonary effort is normal.  Chest:     Chest wall: No tenderness.  Abdominal:     General: There is no distension.  Musculoskeletal:     Cervical back: Normal range of motion and neck supple.     Comments: Left first TP joint of the foot, with erythema redness and swelling and guarding against movement secondary to pain.  No other abnormality of the left foot.  Skin:    General: Skin is warm and dry.  Neurological:     Mental Status: She is alert and oriented to person, place, and time.     Motor: No abnormal muscle tone.  Psychiatric:        Mood and Affect: Mood normal.        Behavior: Behavior normal.        Thought Content: Thought content normal.        Judgment: Judgment normal.    ED Results / Procedures / Treatments   Labs (all labs ordered are listed, but only abnormal results are displayed) Labs Reviewed - No data to display  EKG None  Radiology DG Foot Complete Left  Result Date: 06/10/2021 CLINICAL DATA:  60 year old female with left foot pain. EXAM: LEFT FOOT - COMPLETE 3+ VIEW COMPARISON:  None. FINDINGS: There is no acute fracture or dislocation. The bones are well mineralized. No arthritic changes. There is soft tissue swelling of the dorsum the foot. No radiopaque foreign object or soft tissue  gas. IMPRESSION: 1. No acute fracture or dislocation. 2. Soft tissue swelling the dorsum of the foot. Electronically Signed   By: 66 M.D.   On: 06/10/2021 21:41    Procedures Procedures   Medications Ordered in ED Medications  predniSONE (DELTASONE) tablet 60 mg (60 mg Oral Given 06/10/21 2237)    ED Course  I have reviewed the triage vital signs and the nursing  notes.  Pertinent labs & imaging results that were available during my care of the patient were reviewed by me and considered in my medical decision making (see chart for details).    MDM Rules/Calculators/A&P                           Patient Vitals for the past 24 hrs:  BP Temp Temp src Pulse Resp SpO2 Height Weight  06/10/21 2230 (!) 224/114 -- -- -- -- -- -- --  06/10/21 2059 -- 99.3 F (37.4 C) Oral -- -- -- -- --  06/10/21 2050 (!) 236/124 -- -- 71 18 98 % -- --  06/10/21 2002 -- -- -- -- -- -- 5\' 5"  (1.651 m) 85.7 kg  06/10/21 2000 (!) 250/128 (!) 96.7 F (35.9 C) Temporal 72 -- 98 % -- --    At time of discharge- reevaluation with update and discussion. After initial assessment and treatment, an updated evaluation reveals she is comfortable has no further complaints.08/11/21   Medical Decision Making:  This patient is presenting for evaluation of toe pain and swelling, which does require a range of treatment options, and is a complaint that involves a moderate risk of morbidity and mortality. The differential diagnoses include fracture, cellulitis, arthritis fracture, cellulitis, arthritis. I decided to review old records, and in summary healthy middle-aged female presenting with nonspecific symptoms.  I did not require additional historical information from anyone.  Radiologic Tests Ordered, included left foot x-ray.  I independently Visualized: Radiographic images, which show no acute abnormality no fracture or dislocation.  Mild swelling present     Critical Interventions-clinical  evaluation, radiography, observation reassess  After These Interventions, the Patient was reevaluated and was found stable for discharge.  Patient with clinically evident gout left big toe.  Patient with markedly elevated blood pressure however is completely asymptomatic for it.  She takes her medicines regularly but did not take them today.  She normally takes them at nighttime because if she takes in the daytime it makes her sleepy.  She is instructed to take her medicines as soon as she gets home.  She is instructed return here if she has other problems or concerns.  Otherwise follow-up with PCP in a week for checkup and repeat blood pressure.  CRITICAL CARE-no Performed by: Mancel Bale  Nursing Notes Reviewed/ Care Coordinated Applicable Imaging Reviewed Interpretation of Laboratory Data incorporated into ED treatment  The patient appears reasonably screened and/or stabilized for discharge and I doubt any other medical condition or other [facility] requiring further screening, evaluation, or treatment in the ED at this time prior to discharge.  Plan: Home Medications-symptomatic treatment as needed; Home Treatments-rest, elevation, heat; return here if the recommended treatment, does not improve the symptoms; Recommended follow up-PCP follow-up 1 week and as needed     Final Clinical Impression(s) / ED Diagnoses Final diagnoses:  Acute gout involving toe of left foot, unspecified cause    Rx / DC Orders ED Discharge Orders          Ordered    predniSONE (DELTASONE) 20 MG tablet  2 times daily        06/10/21 2226    HYDROcodone-acetaminophen (NORCO) 5-325 MG tablet  Every 4 hours PRN        06/10/21 2226             2227, MD 06/11/21 [phone]

## 2021-06-28 ENCOUNTER — Other Ambulatory Visit: Payer: Self-pay | Admitting: Physician Assistant

## 2021-06-28 DIAGNOSIS — Z780 Asymptomatic menopausal state: Secondary | ICD-10-CM

## 2021-07-18 ENCOUNTER — Other Ambulatory Visit: Payer: Self-pay | Admitting: Physician Assistant

## 2021-07-18 DIAGNOSIS — R928 Other abnormal and inconclusive findings on diagnostic imaging of breast: Secondary | ICD-10-CM

## 2021-08-21 ENCOUNTER — Other Ambulatory Visit: Payer: Self-pay | Admitting: Endocrinology

## 2021-08-23 ENCOUNTER — Other Ambulatory Visit: Payer: Self-pay | Admitting: Physician Assistant

## 2021-08-23 DIAGNOSIS — T148XXA Other injury of unspecified body region, initial encounter: Secondary | ICD-10-CM

## 2021-08-23 DIAGNOSIS — N63 Unspecified lump in unspecified breast: Secondary | ICD-10-CM

## 2021-08-28 ENCOUNTER — Other Ambulatory Visit: Payer: Self-pay

## 2021-08-28 ENCOUNTER — Ambulatory Visit
Admission: RE | Admit: 2021-08-28 | Discharge: 2021-08-28 | Disposition: A | Discharge status: Home / Self Care | Payer: PRIVATE HEALTH INSURANCE | Source: Ambulatory Visit | Attending: Physician Assistant | Admitting: Physician Assistant

## 2021-08-28 ENCOUNTER — Ambulatory Visit: Payer: No Typology Code available for payment source

## 2021-08-28 DIAGNOSIS — T148XXA Other injury of unspecified body region, initial encounter: Secondary | ICD-10-CM

## 2021-08-28 DIAGNOSIS — N63 Unspecified lump in unspecified breast: Secondary | ICD-10-CM

## 2022-01-03 ENCOUNTER — Other Ambulatory Visit: Payer: No Typology Code available for payment source

## 2022-05-02 ENCOUNTER — Other Ambulatory Visit: Payer: Self-pay | Admitting: Physician Assistant

## 2022-05-02 DIAGNOSIS — Z780 Asymptomatic menopausal state: Secondary | ICD-10-CM

## 2022-05-06 ENCOUNTER — Other Ambulatory Visit: Payer: PRIVATE HEALTH INSURANCE

## 2022-10-10 ENCOUNTER — Ambulatory Visit
Admission: RE | Admit: 2022-10-10 | Discharge: 2022-10-10 | Disposition: A | Discharge status: Home / Self Care | Payer: PRIVATE HEALTH INSURANCE | Source: Ambulatory Visit | Attending: Physician Assistant | Admitting: Physician Assistant

## 2022-10-10 DIAGNOSIS — Z780 Asymptomatic menopausal state: Secondary | ICD-10-CM
